# Patient Record
Sex: Male | Born: 1960 | Marital: Married | Smoking: Current every day smoker
Health system: Southern US, Community
[De-identification: ages and names within clinical notes are randomized; demographics above are authoritative.]

## PROBLEM LIST (undated history)

## (undated) DIAGNOSIS — M1990 Unspecified osteoarthritis, unspecified site: Secondary | ICD-10-CM

## (undated) DIAGNOSIS — F1010 Alcohol abuse, uncomplicated: Secondary | ICD-10-CM

## (undated) DIAGNOSIS — K5900 Constipation, unspecified: Secondary | ICD-10-CM

## (undated) DIAGNOSIS — I83813 Varicose veins of bilateral lower extremities with pain: Secondary | ICD-10-CM

## (undated) DIAGNOSIS — Z720 Tobacco use: Secondary | ICD-10-CM

## (undated) DIAGNOSIS — E785 Hyperlipidemia, unspecified: Secondary | ICD-10-CM

## (undated) DIAGNOSIS — M5430 Sciatica, unspecified side: Secondary | ICD-10-CM

## (undated) DIAGNOSIS — I2510 Atherosclerotic heart disease of native coronary artery without angina pectoris: Secondary | ICD-10-CM

## (undated) DIAGNOSIS — I252 Old myocardial infarction: Secondary | ICD-10-CM

## (undated) DIAGNOSIS — E782 Mixed hyperlipidemia: Secondary | ICD-10-CM

## (undated) DIAGNOSIS — G8929 Other chronic pain: Secondary | ICD-10-CM

## (undated) HISTORY — DX: Old myocardial infarction: I25.2

## (undated) HISTORY — DX: Alcohol abuse, uncomplicated: F10.10

## (undated) HISTORY — DX: Mixed hyperlipidemia: E78.2

## (undated) HISTORY — DX: Tobacco use: Z72.0

## (undated) HISTORY — DX: Varicose veins of bilateral lower extremities with pain: I83.813

## (undated) HISTORY — DX: Sciatica, unspecified side: M54.30

## (undated) HISTORY — DX: Constipation, unspecified: K59.00

## (undated) HISTORY — DX: Unspecified osteoarthritis, unspecified site: M19.90

## (undated) HISTORY — DX: Other chronic pain: G89.29

---

## 2017-04-05 ENCOUNTER — Inpatient Hospital Stay (HOSPITAL_COMMUNITY)
Admission: EM | Admit: 2017-04-05 | Discharge: 2017-04-07 | DRG: 247 | Disposition: A | Discharge status: Home / Self Care | Payer: BLUE CROSS/BLUE SHIELD | Source: Other Acute Inpatient Hospital | Attending: Interventional Cardiology | Admitting: Interventional Cardiology

## 2017-04-05 ENCOUNTER — Encounter (HOSPITAL_COMMUNITY)
Admission: EM | Disposition: A | Discharge status: Home / Self Care | Payer: Self-pay | Source: Other Acute Inpatient Hospital | Attending: Interventional Cardiology

## 2017-04-05 ENCOUNTER — Encounter (HOSPITAL_COMMUNITY): Payer: Self-pay | Admitting: Cardiology

## 2017-04-05 DIAGNOSIS — F17210 Nicotine dependence, cigarettes, uncomplicated: Secondary | ICD-10-CM | POA: Diagnosis present

## 2017-04-05 DIAGNOSIS — I2119 ST elevation (STEMI) myocardial infarction involving other coronary artery of inferior wall: Principal | ICD-10-CM | POA: Diagnosis present

## 2017-04-05 DIAGNOSIS — I252 Old myocardial infarction: Secondary | ICD-10-CM

## 2017-04-05 DIAGNOSIS — Z8249 Family history of ischemic heart disease and other diseases of the circulatory system: Secondary | ICD-10-CM | POA: Diagnosis not present

## 2017-04-05 DIAGNOSIS — I2510 Atherosclerotic heart disease of native coronary artery without angina pectoris: Secondary | ICD-10-CM | POA: Diagnosis present

## 2017-04-05 DIAGNOSIS — E782 Mixed hyperlipidemia: Secondary | ICD-10-CM | POA: Diagnosis present

## 2017-04-05 DIAGNOSIS — Z955 Presence of coronary angioplasty implant and graft: Secondary | ICD-10-CM | POA: Diagnosis not present

## 2017-04-05 DIAGNOSIS — Z720 Tobacco use: Secondary | ICD-10-CM

## 2017-04-05 DIAGNOSIS — R0789 Other chest pain: Secondary | ICD-10-CM | POA: Diagnosis present

## 2017-04-05 DIAGNOSIS — F1010 Alcohol abuse, uncomplicated: Secondary | ICD-10-CM

## 2017-04-05 HISTORY — DX: Old myocardial infarction: I25.2

## 2017-04-05 HISTORY — DX: Atherosclerotic heart disease of native coronary artery without angina pectoris: I25.10

## 2017-04-05 HISTORY — DX: Tobacco use: Z72.0

## 2017-04-05 HISTORY — DX: Alcohol abuse, uncomplicated: F10.10

## 2017-04-05 HISTORY — PX: CORONARY STENT INTERVENTION: CATH118234

## 2017-04-05 HISTORY — PX: LEFT HEART CATH AND CORONARY ANGIOGRAPHY: CATH118249

## 2017-04-05 HISTORY — DX: Hyperlipidemia, unspecified: E78.5

## 2017-04-05 LAB — COMPREHENSIVE METABOLIC PANEL
ALBUMIN: 3.6 g/dL (ref 3.5–5.0)
ALK PHOS: 75 U/L (ref 38–126)
ALT: 32 U/L (ref 17–63)
ANION GAP: 12 (ref 5–15)
AST: 36 U/L (ref 15–41)
BILIRUBIN TOTAL: 0.9 mg/dL (ref 0.3–1.2)
BUN: 14 mg/dL (ref 6–20)
CO2: 17 mmol/L — ABNORMAL LOW (ref 22–32)
Calcium: 8.7 mg/dL — ABNORMAL LOW (ref 8.9–10.3)
Chloride: 104 mmol/L (ref 101–111)
Creatinine, Ser: 0.91 mg/dL (ref 0.61–1.24)
GFR calc non Af Amer: >60 mL/min (ref >60)
GLUCOSE: 184 mg/dL — AB (ref 65–99)
Potassium: 3.2 mmol/L — ABNORMAL LOW (ref 3.5–5.1)
Sodium: 133 mmol/L — ABNORMAL LOW (ref 135–145)
Total Protein: 6.4 g/dL — ABNORMAL LOW (ref 6.5–8.1)

## 2017-04-05 LAB — TROPONIN I
TROPONIN I: 0.93 ng/mL — AB (ref <0.03)
TROPONIN I: 4.64 ng/mL — AB (ref <0.03)
Troponin I: 0.06 ng/mL (ref <0.03)
Troponin I: 3.16 ng/mL (ref <0.03)

## 2017-04-05 LAB — CBC
HCT: 42.9 % (ref 39.0–52.0)
HEMOGLOBIN: 14.4 g/dL (ref 13.0–17.0)
MCH: 32.1 pg (ref 26.0–34.0)
MCHC: 33.6 g/dL (ref 30.0–36.0)
MCV: 95.8 fL (ref 78.0–100.0)
Platelets: 190 10*3/uL (ref 150–400)
RBC: 4.48 MIL/uL (ref 4.22–5.81)
RDW: 12.3 % (ref 11.5–15.5)
WBC: 7.3 10*3/uL (ref 4.0–10.5)

## 2017-04-05 LAB — POCT I-STAT, CHEM 8
BUN: 16 mg/dL (ref 6–20)
CHLORIDE: 103 mmol/L (ref 101–111)
Calcium, Ion: 1.17 mmol/L (ref 1.15–1.40)
Creatinine, Ser: 0.7 mg/dL (ref 0.61–1.24)
GLUCOSE: 181 mg/dL — AB (ref 65–99)
HCT: 41 % (ref 39.0–52.0)
HEMOGLOBIN: 13.9 g/dL (ref 13.0–17.0)
POTASSIUM: 3.3 mmol/L — AB (ref 3.5–5.1)
SODIUM: 135 mmol/L (ref 135–145)
TCO2: 19 mmol/L (ref 0–100)

## 2017-04-05 LAB — BRAIN NATRIURETIC PEPTIDE: B NATRIURETIC PEPTIDE 5: 38.1 pg/mL (ref 0.0–100.0)

## 2017-04-05 LAB — MRSA PCR SCREENING: MRSA by PCR: NEGATIVE

## 2017-04-05 LAB — MAGNESIUM: MAGNESIUM: 2.1 mg/dL (ref 1.7–2.4)

## 2017-04-05 LAB — LIPID PANEL
CHOLESTEROL: 198 mg/dL (ref 0–200)
HDL: 32 mg/dL — ABNORMAL LOW (ref >40)
LDL CALC: 106 mg/dL — AB (ref 0–99)
Total CHOL/HDL Ratio: 6.2 RATIO
Triglycerides: 302 mg/dL — ABNORMAL HIGH (ref <150)
VLDL: 60 mg/dL — AB (ref 0–40)

## 2017-04-05 LAB — PROTIME-INR
INR: 1.22
PROTHROMBIN TIME: 15.5 s — AB (ref 11.4–15.2)

## 2017-04-05 LAB — PHOSPHORUS: PHOSPHORUS: 3.2 mg/dL (ref 2.5–4.6)

## 2017-04-05 LAB — APTT: aPTT: >200 seconds (ref 24–36)

## 2017-04-05 LAB — POCT ACTIVATED CLOTTING TIME: ACTIVATED CLOTTING TIME: 323 s

## 2017-04-05 SURGERY — LEFT HEART CATH AND CORONARY ANGIOGRAPHY
Anesthesia: LOCAL

## 2017-04-05 MED ORDER — FENTANYL CITRATE (PF) 100 MCG/2ML IJ SOLN
INTRAMUSCULAR | Status: DC | PRN
  Administered 2017-04-05: 50 ug via INTRAVENOUS

## 2017-04-05 MED ORDER — TICAGRELOR 90 MG PO TABS
ORAL_TABLET | ORAL | Status: AC
Start: 2017-04-05 — End: ?
  Filled 2017-04-05: qty 2

## 2017-04-05 MED ORDER — VITAMIN B-1 100 MG PO TABS
100.0000 mg | ORAL_TABLET | Freq: Every day | ORAL | Status: DC
  Administered 2017-04-05 – 2017-04-07 (×3): 100 mg via ORAL
  Filled 2017-04-05 (×3): qty 1

## 2017-04-05 MED ORDER — HEPARIN (PORCINE) IN NACL 2-0.9 UNIT/ML-% IJ SOLN
INTRAMUSCULAR | Status: AC | PRN
  Administered 2017-04-05: 1500 mL

## 2017-04-05 MED ORDER — OXYCODONE-ACETAMINOPHEN 5-325 MG PO TABS
1.0000 | ORAL_TABLET | ORAL | Status: DC | PRN
  Administered 2017-04-05: 2 via ORAL
  Filled 2017-04-05: qty 2

## 2017-04-05 MED ORDER — SODIUM CHLORIDE 0.9% FLUSH: 3.0000 mL | INTRAVENOUS | Status: DC | PRN

## 2017-04-05 MED ORDER — HEPARIN (PORCINE) IN NACL 2-0.9 UNIT/ML-% IJ SOLN
INTRAMUSCULAR | Status: AC
Start: 2017-04-05 — End: ?
  Filled 2017-04-05: qty 1000

## 2017-04-05 MED ORDER — TIROFIBAN (AGGRASTAT) BOLUS VIA INFUSION
INTRAVENOUS | Status: DC | PRN
  Administered 2017-04-05: 1825 ug via INTRAVENOUS

## 2017-04-05 MED ORDER — IOPAMIDOL (ISOVUE-370) INJECTION 76%
INTRAVENOUS | Status: DC | PRN
  Administered 2017-04-05: 110 mL via INTRA_ARTERIAL

## 2017-04-05 MED ORDER — ONDANSETRON HCL 4 MG/2ML IJ SOLN: 4.0000 mg | Freq: Four times a day (QID) | INTRAMUSCULAR | Status: DC | PRN

## 2017-04-05 MED ORDER — HEPARIN (PORCINE) IN NACL 2-0.9 UNIT/ML-% IJ SOLN
INTRAMUSCULAR | Status: AC
Start: 2017-04-05 — End: ?
  Filled 2017-04-05: qty 500

## 2017-04-05 MED ORDER — HEPARIN (PORCINE) IN NACL 2-0.9 UNIT/ML-% IJ SOLN
INTRAMUSCULAR | Status: DC | PRN
  Administered 2017-04-05: 10 mL via INTRA_ARTERIAL

## 2017-04-05 MED ORDER — TICAGRELOR 90 MG PO TABS
ORAL_TABLET | ORAL | Status: DC | PRN
  Administered 2017-04-05: 180 mg via ORAL

## 2017-04-05 MED ORDER — TICAGRELOR 90 MG PO TABS
90.0000 mg | ORAL_TABLET | Freq: Two times a day (BID) | ORAL | Status: DC
  Administered 2017-04-05 – 2017-04-07 (×4): 90 mg via ORAL
  Filled 2017-04-05 (×4): qty 1

## 2017-04-05 MED ORDER — HEPARIN SODIUM (PORCINE) 1000 UNIT/ML IJ SOLN
INTRAMUSCULAR | Status: AC
  Filled 2017-04-05: qty 1

## 2017-04-05 MED ORDER — FOLIC ACID 1 MG PO TABS
1.0000 mg | ORAL_TABLET | Freq: Every day | ORAL | Status: DC
  Administered 2017-04-05 – 2017-04-07 (×3): 1 mg via ORAL
  Filled 2017-04-05 (×3): qty 1

## 2017-04-05 MED ORDER — SODIUM CHLORIDE 0.9 % IV SOLN: INTRAVENOUS | Status: AC

## 2017-04-05 MED ORDER — ACETAMINOPHEN 325 MG PO TABS: 650.0000 mg | ORAL_TABLET | ORAL | Status: DC | PRN

## 2017-04-05 MED ORDER — MIDAZOLAM HCL 2 MG/2ML IJ SOLN
INTRAMUSCULAR | Status: AC
  Filled 2017-04-05: qty 2

## 2017-04-05 MED ORDER — HEPARIN SODIUM (PORCINE) 1000 UNIT/ML IJ SOLN
INTRAMUSCULAR | Status: DC | PRN
  Administered 2017-04-05: 10000 [IU] via INTRAVENOUS

## 2017-04-05 MED ORDER — LIDOCAINE HCL 1 % IJ SOLN
INTRAMUSCULAR | Status: AC
  Filled 2017-04-05: qty 20

## 2017-04-05 MED ORDER — MIDAZOLAM HCL 2 MG/2ML IJ SOLN
INTRAMUSCULAR | Status: DC | PRN
  Administered 2017-04-05: 1 mg via INTRAVENOUS

## 2017-04-05 MED ORDER — TIROFIBAN HCL IN NACL 5-0.9 MG/100ML-% IV SOLN
INTRAVENOUS | Status: AC
  Filled 2017-04-05: qty 100

## 2017-04-05 MED ORDER — FENTANYL CITRATE (PF) 100 MCG/2ML IJ SOLN
INTRAMUSCULAR | Status: AC
  Filled 2017-04-05: qty 2

## 2017-04-05 MED ORDER — VERAPAMIL HCL 2.5 MG/ML IV SOLN
INTRAVENOUS | Status: AC
  Filled 2017-04-05: qty 2

## 2017-04-05 MED ORDER — TIROFIBAN HCL IN NACL 5-0.9 MG/100ML-% IV SOLN
INTRAVENOUS | Status: AC | PRN
  Administered 2017-04-05: 0.15 ug/kg/min via INTRAVENOUS

## 2017-04-05 MED ORDER — NITROGLYCERIN 1 MG/10 ML FOR IR/CATH LAB
INTRA_ARTERIAL | Status: AC
  Filled 2017-04-05: qty 10

## 2017-04-05 MED ORDER — ATORVASTATIN CALCIUM 80 MG PO TABS
80.0000 mg | ORAL_TABLET | Freq: Every day | ORAL | Status: DC
Start: 2017-04-05 — End: 2017-04-07
  Administered 2017-04-05 – 2017-04-06 (×2): 80 mg via ORAL
  Filled 2017-04-05 (×2): qty 1

## 2017-04-05 MED ORDER — SODIUM CHLORIDE 0.9 % IV SOLN: 250.0000 mL | INTRAVENOUS | Status: DC | PRN

## 2017-04-05 MED ORDER — SODIUM CHLORIDE 0.9% FLUSH
3.0000 mL | Freq: Two times a day (BID) | INTRAVENOUS | Status: DC
  Administered 2017-04-05 – 2017-04-07 (×3): 3 mL via INTRAVENOUS

## 2017-04-05 MED ORDER — NITROGLYCERIN 1 MG/10 ML FOR IR/CATH LAB
INTRA_ARTERIAL | Status: DC | PRN
  Administered 2017-04-05: 200 ug via INTRACORONARY

## 2017-04-05 MED ORDER — IOPAMIDOL (ISOVUE-370) INJECTION 76%
INTRAVENOUS | Status: AC
  Filled 2017-04-05: qty 125

## 2017-04-05 MED ORDER — TIROFIBAN HCL IV 12.5 MG/250 ML
0.1500 ug/kg/min | INTRAVENOUS | Status: AC
  Administered 2017-04-05: 0.15 ug/kg/min via INTRAVENOUS
  Filled 2017-04-05: qty 250

## 2017-04-05 MED ORDER — NITROGLYCERIN IN D5W 200-5 MCG/ML-% IV SOLN: 20.0000 ug/min | INTRAVENOUS | Status: AC

## 2017-04-05 MED ORDER — HEPARIN SODIUM (PORCINE) 5000 UNIT/ML IJ SOLN
5000.0000 [IU] | Freq: Three times a day (TID) | INTRAMUSCULAR | Status: DC
  Administered 2017-04-05 – 2017-04-07 (×5): 5000 [IU] via SUBCUTANEOUS
  Filled 2017-04-05 (×5): qty 1

## 2017-04-05 MED ORDER — LORAZEPAM 2 MG/ML IJ SOLN: 2.0000 mg | INTRAMUSCULAR | Status: DC | PRN

## 2017-04-05 MED ORDER — ASPIRIN 81 MG PO CHEW
81.0000 mg | CHEWABLE_TABLET | Freq: Every day | ORAL | Status: DC
  Administered 2017-04-06 – 2017-04-07 (×2): 81 mg via ORAL
  Filled 2017-04-05 (×2): qty 1

## 2017-04-05 SURGICAL SUPPLY — 19 items
BALLN EMERGE MR 2.5X15 (BALLOONS) ×2
BALLN ~~LOC~~ EUPHORA RX 4.0X20 (BALLOONS) ×2
BALLOON EMERGE MR 2.5X15 (BALLOONS) ×1 IMPLANT
BALLOON ~~LOC~~ EUPHORA RX 4.0X20 (BALLOONS) ×1 IMPLANT
CATH INFINITI 5 FR JL3.5 (CATHETERS) ×2 IMPLANT
CATH LAUNCHER 6FR JR4 (CATHETERS) ×2 IMPLANT
DEVICE RAD COMP TR BAND LRG (VASCULAR PRODUCTS) ×2 IMPLANT
ELECT DEFIB PAD ADLT CADENCE (PAD) ×2 IMPLANT
GLIDESHEATH SLEND A-KIT 6F 22G (SHEATH) ×2 IMPLANT
GUIDEWIRE INQWIRE 1.5J.035X260 (WIRE) ×1 IMPLANT
INQWIRE 1.5J .035X260CM (WIRE) ×2
KIT ENCORE 26 ADVANTAGE (KITS) ×2 IMPLANT
KIT HEART LEFT (KITS) ×2 IMPLANT
PACK CARDIAC CATHETERIZATION (CUSTOM PROCEDURE TRAY) ×2 IMPLANT
STENT RESOLUTE ONYX 3.5X38 (Permanent Stent) ×2 IMPLANT
TRANSDUCER W/STOPCOCK (MISCELLANEOUS) ×2 IMPLANT
TUBING CIL FLEX 10 FLL-RA (TUBING) ×2 IMPLANT
WIRE ASAHI PROWATER 180CM (WIRE) ×2 IMPLANT
WIRE ASAHI PROWATER 300CM (WIRE) IMPLANT

## 2017-04-05 NOTE — Progress Notes (Signed)
Chaplain was paged for a Stemi. Chaplain met Pt in hallway tranprterd to cath lab. Family were already sent up. Chaplain went to Alexander Larson and help son check in.    04/05/17 0700  Clinical Encounter Type  Visited With Family  Visit Type Initial  Referral From Care management  Stress Factors  Family Stress Factors None identified

## 2017-04-05 NOTE — Progress Notes (Signed)
CRITICAL VALUE ALERT  Critical Value:  Trop 0.06  Date & Time Notied:  04/05/17 0800  Provider Notified: Katrinka BlazingSmith  Orders Received/Actions taken:

## 2017-04-05 NOTE — H&P (Addendum)
The patient has been seen in conjunction with Reino Bellis, NP. All aspects of care have been considered and discussed. The patient has been personally interviewed, examined, and all clinical data has been reviewed.   The patient was personally met exam once a day. He appeared very ill with diaphoresis, nausea, and a somewhat gray skin color. Ongoing chest discomfort was indoor stent. It started suddenly at 7 AM at work. Only significant medical history is tobacco abuse.  A brief examination was performed demonstrating brisk/palpable carotid radial and posterior tibial pulses bilaterally. No murmur or rub was heard. Lungs were clear. No peripheral edema was noted.  He was brought immediately to the catheterization laboratory where his exam was completed and felt to be normal.  Transmitted EKG demonstrated ST elevation II, III, aVF, V5 and V6. A subsequent EKG upon arrival reveal resolution of ST elevation in V5 and V6 but with ongoing inferior changes. EKGs were personally reviewed.  He was counseled undergo immediate cardiac catheterization and mechanical revascularization if indicated and was brought directly to the cardiac catheterization laboratory without stopping in the ER.  Please note that after speaking with the son, patient drinks alcohol daily and according to the son heavily. Therefore we need to watch for signs of alcohol withdrawal.  Critical care time 31 minutes.    History & Physical    Patient ID: Alexander Larson MRN: [id number], DOB/AGE: 1961/04/25   Admit date: 04/05/2017   Primary Physician: Alycia Rossetti, MD Primary Cardiologist: Suan Halter)   Patient Profile    56 yo male with no PMH other then tobacco use who presented with chest pain, called a Code Stemi in the field.   Past Medical History   Past Medical History:  Diagnosis Date  . Tobacco use     History reviewed. No pertinent surgical history.   Allergies  No Known Allergies  History of  Present Illness    Alexander Larson is a 56 yo male with only reported hx of tobacco use. Reports he developed SScp at 7am this morning while at work. Works at [employer], doing physical labor. On EMS arrival he was given 324 ASA, but vomited this up. He was pale and diaphoretic. Given Zofran and 1 SL nitro with no improvement in pain. EKG showed inferior ST elevation with lateral ST depression. Code STEMI was activated in the field. Was noted to be hypertensive with EMS. He was brought to the cath lab emergent for cardiac catheterization with Dr. Tamala Julian.   Home Medications    Prior to Admission medications   Not on File    Family History    Family History  Problem Relation Age of Onset  . Hypertension Father     Social History    Social History   Social History  . Marital status: Married    Spouse name: N/A  . Number of children: N/A  . Years of education: N/A   Occupational History  . Not on file.   Social History Main Topics  . Smoking status: Current Every Day Smoker    Types: Cigarettes  . Smokeless tobacco: Not on file  . Alcohol use Not on file  . Drug use: Unknown  . Sexual activity: Not on file   Other Topics Concern  . Not on file   Social History Narrative  . No narrative on file     Review of Systems    See HPI  All other systems reviewed and are otherwise negative except as noted  above.  Physical Exam    SpO2 100 %.  General: WM, pale and diaphoretic  Psych: Normal affect. Neuro: Alert and oriented X 3. Moves all extremities spontaneously. HEENT: Normal  Neck: Supple without bruits or JVD. Lungs:  Resp regular and unlabored, CTA. Heart: RRR no s3, s4, or murmurs. Abdomen: Soft, non-tender, non-distended, BS + x 4.  Extremities: No clubbing, cyanosis or edema. DP/PT/Radials 2+ and equal bilaterally.  Labs    Troponin (Point of Care Test) No results for input(s): TROPIPOC in the last 72 hours. No results for input(s): CKTOTAL, CKMB, TROPONINI in  the last 72 hours. No results found for: WBC, HGB, HCT, MCV, PLT No results for input(s): NA, K, CL, CO2, BUN, CREATININE, CALCIUM, PROT, BILITOT, ALKPHOS, ALT, AST, GLUCOSE in the last 168 hours.  Invalid input(s): LABALBU No results found for: CHOL, HDL, LDLCALC, TRIG No results found for: DDIMER   Radiology Studies    No results found.  ECG & Cardiac Imaging    EKG: SR with ST elevation in inferior leads  Assessment & Plan    56 yo male with no PMH other then tobacco use who presented with chest pain, called a Code Stemi in the field.   STEMI: Presented with sudden onset of SScp with nausea and diaphoresis while at work this morning at 7am. EKG showed ST elevation in inferior leads. Given 324 ASA, 1 SL nitro and zofran PTA. Brought directly to the cath lab for emergent cardiac catheterization. Further recommendations following procedure.   Signed, Lindsay Roberts, NP-C Pager [phone] 04/05/2017, 7:45 AM  

## 2017-04-05 NOTE — Progress Notes (Signed)
CRITICAL VALUE ALERT  Critical Value:  APTT >200  Date & Time Notied:  04/05/17 0920  Provider Notified: Su Hiltoberts  Orders Received/Actions taken: no new orders

## 2017-04-05 NOTE — Progress Notes (Signed)
CRITICAL VALUE ALERT  Critical Value:  Trop 0.93  Date & Time Notied:  04/05/17 1117  Provider Notified: Su Hiltoberts  Orders Received/Actions taken: no new orders, patient stable

## 2017-04-05 NOTE — Progress Notes (Signed)
   04/05/17 0950  Clinical Encounter Type  Visited With Patient  Visit Type Follow-up  Spiritual Encounters  Spiritual Needs Emotional  Stress Factors  Patient Stress Factors Health changes  Introduction to Pt. Pt reports coping well.

## 2017-04-06 ENCOUNTER — Encounter (HOSPITAL_COMMUNITY): Payer: Self-pay

## 2017-04-06 DIAGNOSIS — E782 Mixed hyperlipidemia: Secondary | ICD-10-CM

## 2017-04-06 DIAGNOSIS — Z955 Presence of coronary angioplasty implant and graft: Secondary | ICD-10-CM

## 2017-04-06 LAB — BASIC METABOLIC PANEL
ANION GAP: 11 (ref 5–15)
BUN: 13 mg/dL (ref 6–20)
CALCIUM: 9 mg/dL (ref 8.9–10.3)
CO2: 20 mmol/L — AB (ref 22–32)
Chloride: 103 mmol/L (ref 101–111)
Creatinine, Ser: 0.84 mg/dL (ref 0.61–1.24)
GFR calc Af Amer: >60 mL/min (ref >60)
GFR calc non Af Amer: >60 mL/min (ref >60)
Glucose, Bld: 104 mg/dL — ABNORMAL HIGH (ref 65–99)
Potassium: 4 mmol/L (ref 3.5–5.1)
Sodium: 134 mmol/L — ABNORMAL LOW (ref 135–145)

## 2017-04-06 LAB — HEMOGLOBIN A1C
HEMOGLOBIN A1C: 5.6 % (ref 4.8–5.6)
Mean Plasma Glucose: 114 mg/dL

## 2017-04-06 MED ORDER — OMEGA-3-ACID ETHYL ESTERS 1 G PO CAPS
2.0000 g | ORAL_CAPSULE | Freq: Every day | ORAL | Status: DC
  Administered 2017-04-06 – 2017-04-07 (×2): 2 g via ORAL
  Filled 2017-04-06 (×2): qty 2

## 2017-04-06 MED ORDER — CARVEDILOL 3.125 MG PO TABS
3.1250 mg | ORAL_TABLET | Freq: Two times a day (BID) | ORAL | Status: DC
  Administered 2017-04-06 – 2017-04-07 (×2): 3.125 mg via ORAL
  Filled 2017-04-06 (×2): qty 1

## 2017-04-06 MED ORDER — LISINOPRIL 2.5 MG PO TABS
2.5000 mg | ORAL_TABLET | Freq: Every day | ORAL | Status: DC
  Administered 2017-04-06 – 2017-04-07 (×2): 2.5 mg via ORAL
  Filled 2017-04-06 (×2): qty 1

## 2017-04-06 NOTE — Progress Notes (Signed)
Transferred from [facility] ambulatory .

## 2017-04-06 NOTE — Progress Notes (Addendum)
CARDIAC REHAB PHASE I   PRE:  Rate/Rhythm: 72 SR    BP: sitting 125/82    SaO2:   MODE:  Ambulation: 370 ft   POST:  Rate/Rhythm: 90 SR    BP: sitting 139/91     SaO2:   Tolerated well, no c/o. Feels well. BP slightly elevated. Ed completed with pt and son, good reception. Understands importance of Brilinta and is awaiting Brilinta card from CM. He has quit smoking previously and is willing to quit again. Gave resources. Discussed decreasing ETOH as well since triglycerides are high. Good reception. [phone]   Harriet MassonRandi Kristan Freddy Kinne CES, ACSM 04/06/2017 11:59 AM

## 2017-04-06 NOTE — Progress Notes (Signed)
Progress Note  Patient Name: Alexander Larson Date of Encounter: 04/06/2017  Primary Cardiologist: Katrinka BlazingSmith (new)  Subjective   No recurrent chest pain  Inpatient Medications    Scheduled Meds: . aspirin  81 mg Oral Daily  . atorvastatin  80 mg Oral q1800  . folic acid  1 mg Oral Daily  . heparin  5,000 Units Subcutaneous Q8H  . sodium chloride flush  3 mL Intravenous Q12H  . thiamine  100 mg Oral Daily  . ticagrelor  90 mg Oral BID   Continuous Infusions: . sodium chloride     PRN Meds: sodium chloride, acetaminophen, LORazepam, ondansetron (ZOFRAN) IV, oxyCODONE-acetaminophen, sodium chloride flush   Vital Signs    Vitals:   04/06/17 1100 04/06/17 1200 04/06/17 1300 04/06/17 1338  BP:    113/80  Pulse:      Resp: 20 14 (!) 21 18  Temp:      TempSrc:      SpO2: 95%     Weight:    160 lb 15 oz (73 kg)  Height:    5' 7.32" (1.71 m)    Intake/Output Summary (Last 24 hours) at 04/06/17 1401 Last data filed at 04/06/17 1300  Gross per 24 hour  Intake              240 ml  Output              800 ml  Net             -560 ml    I/O since admission:   Filed Weights   04/05/17 0900 04/06/17 0300 04/06/17 1338  Weight: 160 lb 15 oz (73 kg) 160 lb 15 oz (73 kg) 160 lb 15 oz (73 kg)    Telemetry    Sinus in the 70s - Personally Reviewed  ECG    Will repeat ECG today  ECG (independently read by me): SB at 55; nonspecific T change  Physical Exam   BP 113/80   Pulse 72   Temp 99 F (37.2 C) (Oral)   Resp 18   Ht 5' 7.32" (1.71 m)   Wt 160 lb 15 oz (73 kg)   SpO2 95%   BMI 24.96 kg/m  General: Alert, oriented, no distress.  Skin: normal turgor, no rashes, warm and dry HEENT: Normocephalic, atraumatic. Pupils equal round and reactive to light; sclera anicteric; extraocular muscles intact;  Nose without nasal septal hypertrophy Mouth/Parynx benign; Mallinpatti scale 3 Neck: No JVD, no carotid bruits; normal carotid upstroke Lungs: clear to  ausculatation and percussion; no wheezing or rales Chest wall: without tenderness to palpitation Heart: PMI not displaced, RRR, s1 s2 normal, 1/6 systolic murmur, no diastolic murmur, no rubs, gallops, thrills, or heaves Abdomen: soft, nontender; no hepatosplenomehaly, BS+; abdominal aorta nontender and not dilated by palpation. Back: no CVA tenderness Pulses 2+ Musculoskeletal: full range of motion, normal strength, no joint deformities Extremities: no clubbing cyanosis or edema, Homan's sign negative  Neurologic: grossly nonfocal; Cranial nerves grossly wnl Psychologic: Normal mood and affect   Labs    Chemistry Recent Labs Lab 04/05/17 0800 04/05/17 0803 04/06/17 0231  NA 135 133* 134*  K 3.3* 3.2* 4.0  CL 103 104 103  CO2  --  17* 20*  GLUCOSE 181* 184* 104*  BUN 16 14 13   CREATININE 0.70 0.91 0.84  CALCIUM  --  8.7* 9.0  PROT  --  6.4*  --   ALBUMIN  --  3.6  --  AST  --  36  --   ALT  --  32  --   ALKPHOS  --  75  --   BILITOT  --  0.9  --   GFRNONAA  --  >60 >60  GFRAA  --  >60 >60  ANIONGAP  --  12 11     Hematology Recent Labs Lab 04/05/17 0800 04/05/17 0803  WBC  --  7.3  RBC  --  4.48  HGB 13.9 14.4  HCT 41.0 42.9  MCV  --  95.8  MCH  --  32.1  MCHC  --  33.6  RDW  --  12.3  PLT  --  190    Cardiac Enzymes Recent Labs Lab 04/05/17 0803 04/05/17 1006 04/05/17 1420 04/05/17 2154  TROPONINI 0.06* 0.93* 4.64* 3.16*   No results for input(s): TROPIPOC in the last 168 hours.   BNP Recent Labs Lab 04/05/17 1006  BNP 38.1     DDimer No results for input(s): DDIMER in the last 168 hours.   Lipid Panel     Component Value Date/Time   CHOL 198 04/05/2017 0803   TRIG 302 (H) 04/05/2017 0803   HDL 32 (L) 04/05/2017 0803   CHOLHDL 6.2 04/05/2017 0803   VLDL 60 (H) 04/05/2017 0803   LDLCALC 106 (H) 04/05/2017 1610    Radiology    No results found.  Cardiac Studies   Conclusion    Acute inferior ST elevation myocardial  infarction due to acute occlusion of the near ostial RCA.  Successful PTCA and stenting of the totally occluded proximal segment of the RCA with a 38 x 3.5 Onyx DES postdilated to 4 mm in diameter with subsequent 0% with TIMI grade 3 flow.  Widely patent left coronary system. The left system supplied collaterals to the very distal RCA.  Moderate inferobasal hypokinesis. EF 55%. LVEDP is normal.  RECOMMENDATIONS:   Dual antiplatelet therapy, aspirin and Brilinta.  Probable discharge tomorrow if stable. Occlusion time was very short.  Aggressive risk factor modification including smoking cessation.          56 y.o. male who presented with inferior STEMI on 04/05/17.  Assessment & Plan    1. Inferior STEMI: Day 1; cath data reviewed. S/p DES stent to RCA. Now on ASA/Brilinta.  Will add low dose BB with coreg and ACE-I.  Trop 4.64  F/U ECG today.  2: Hyperlipidemia; now on atorvastatin 80 mg. With mixed hyperlipidemic picture will add lovaza.  3. Remote tobacco  Cardiac Rehab;  Begin ambulation.  Will transfer to stepdown.  Possible dc tomorrow if stable.    Signed, Lennette Bihari, MD, [facility] 04/06/2017, 2:01 PM

## 2017-04-06 NOTE — Care Management Note (Addendum)
Case Management Note Marvetta Gibbons RN, BSN Unit 2W-Case Manager-- [place] coverage [phone]  Patient Details  Name: Alexander Larson MRN: [id number] Date of Birth: 11-22-1960  Subjective/Objective:  Pt admitted with STEMI- s/p cath with DES                  Action/Plan: PTA pt lived at home- independent- plan to return home- call received from Cardiac rehab that pt started on Brilinta- per Epic pt shows no insurance- spoke with pt at bedside- pt states he does have insurance- had card with him- copy made of his card-and will fax to admitting- per pt [facility] [address]- [facility]- [facility]- [phone])- and they do have Brilinta in stock to fill today. Will submit for insurance copay cost. Pt given 30 day free card to use on discharge along with copay assist card for Brilinta-- per insurance check pt has not met out of pocket deductible- this info shared with pt at bedside- pt voiced understanding and has both Brilinta cards to use.   Expected Discharge Date:  04/06/17               Expected Discharge Plan:  Home/Self Care  In-House Referral:     Discharge planning Services  CM Consult, Medication Assistance  Post Acute Care Choice:  NA Choice offered to:  NA  DME Arranged:    DME Agency:     HH Arranged:    HH Agency:     Status of Service:  Completed, signed off  If discussed at [place] of Stay Meetings, dates discussed:    Discharge Disposition: home/self care   Additional Comments:  Dawayne Patricia, RN 04/06/2017, 1:42 PM

## 2017-04-06 NOTE — Progress Notes (Signed)
Per insurance check for Brilinta #4. S/W ANGELA @ [facility] # [phone]   1. BRILINTA  90 MG BID   COVER- YES  CO-PAY- $ 347.87 (co pay will be lower once deductible met) TIER- 2 DRUG  PRIOR APPROVAL- NO  90 DAY SUPPLY MAIL-ORDER AND RETAIL $ 952.92  DEDUCTIBLE NOT MET $ 3,000.00    2. CLOPIDOGREL 75 MG  BID AND DAILY   COVER- YES  CO-PAY- ZERO DOLLARS  TIER- 1 DRUG  PRIOR APPROVAL- NO  90 DAY SUPPLY FOR MAIL-ORDER AND RETAIL ZERO DOLLARS   PREFERRED PHARMACY : [facility]

## 2017-04-07 ENCOUNTER — Telehealth: Payer: Self-pay | Admitting: Interventional Cardiology

## 2017-04-07 ENCOUNTER — Encounter (HOSPITAL_COMMUNITY): Payer: Self-pay | Admitting: Cardiology

## 2017-04-07 DIAGNOSIS — I2510 Atherosclerotic heart disease of native coronary artery without angina pectoris: Secondary | ICD-10-CM

## 2017-04-07 LAB — HEMOGLOBIN A1C
Hgb A1c MFr Bld: 5.6 % (ref 4.8–5.6)
Mean Plasma Glucose: 114 mg/dL

## 2017-04-07 LAB — BASIC METABOLIC PANEL
Anion gap: 10 (ref 5–15)
BUN: 14 mg/dL (ref 6–20)
CALCIUM: 9.2 mg/dL (ref 8.9–10.3)
CO2: 21 mmol/L — AB (ref 22–32)
CREATININE: 0.88 mg/dL (ref 0.61–1.24)
Chloride: 103 mmol/L (ref 101–111)
Glucose, Bld: 104 mg/dL — ABNORMAL HIGH (ref 65–99)
Potassium: 3.8 mmol/L (ref 3.5–5.1)
SODIUM: 134 mmol/L — AB (ref 135–145)

## 2017-04-07 MED ORDER — TICAGRELOR 90 MG PO TABS: 90.0000 mg | ORAL_TABLET | Freq: Two times a day (BID) | ORAL | 10 refills | Status: DC

## 2017-04-07 MED ORDER — ATORVASTATIN CALCIUM 80 MG PO TABS: 80.0000 mg | ORAL_TABLET | Freq: Every day | ORAL | 0 refills | Status: DC

## 2017-04-07 MED ORDER — OMEGA-3-ACID ETHYL ESTERS 1 G PO CAPS: 2.0000 g | ORAL_CAPSULE | Freq: Every day | ORAL | 2 refills | Status: DC

## 2017-04-07 MED ORDER — NITROGLYCERIN 0.4 MG SL SUBL: 0.4000 mg | SUBLINGUAL_TABLET | SUBLINGUAL | 3 refills | Status: DC | PRN

## 2017-04-07 MED ORDER — TICAGRELOR 90 MG PO TABS: 90.0000 mg | ORAL_TABLET | Freq: Two times a day (BID) | ORAL | 0 refills | Status: DC

## 2017-04-07 MED ORDER — LISINOPRIL 2.5 MG PO TABS: 2.5000 mg | ORAL_TABLET | Freq: Every day | ORAL | 0 refills | Status: DC

## 2017-04-07 MED ORDER — ASPIRIN 81 MG PO CHEW: 81.0000 mg | CHEWABLE_TABLET | Freq: Every day | ORAL | Status: DC

## 2017-04-07 MED ORDER — CARVEDILOL 3.125 MG PO TABS: 3.1250 mg | ORAL_TABLET | Freq: Two times a day (BID) | ORAL | 2 refills | Status: DC

## 2017-04-07 NOTE — Progress Notes (Signed)
Discharge instructions printed and reviewed with patient and daughter, and copy given for them to take home. All questions addressed at this time. New prescriptions reviewed. Printed and reviewed educational handouts for brilinta, heart attack, radial site care, and cardiac catheterization with stenting. Emphasized signs and symptoms of heart attack and radial site bleeding, and to call 911 for these emergencies. IV site removed, and cath site assessed for bleeding/bruising (none noted). Room searched for patient belongings, and confirmed with patient that all valuables were accounted for. Staff escorted patient to discharge via wheelchair.

## 2017-04-07 NOTE — Care Management Note (Signed)
Case Management Note Original note created by Irven Shelling RN, BSN Unit 2W-Case Manager-- [place] coverage [phone]  Patient Details  Name: Alexander Larson MRN: [id number] Date of Birth: Feb 24, 1961  Subjective/Objective:  Pt admitted with STEMI- s/p cath with DES                  Action/Plan: PTA pt lived at home- independent- plan to return home- call received from Cardiac rehab that pt started on Brilinta- per Epic pt shows no insurance- spoke with pt at bedside- pt states he does have insurance- had card with him- copy made of his card-and will fax to admitting- per pt [facility] [address]- [facility]- [facility]- [phone])- and they do have Brilinta in stock to fill today. Will submit for insurance copay cost. Pt given 30 day free card to use on discharge along with copay assist card for Brilinta-- per insurance check pt has not met out of pocket deductible- this info shared with pt at bedside- pt voiced understanding and has both Brilinta cards to use.   Expected Discharge Date:  04/06/17               Expected Discharge Plan:  Home/Self Care  In-House Referral:     Discharge planning Services  CM Consult, Medication Assistance  Post Acute Care Choice:  NA Choice offered to:  NA  DME Arranged:    DME Agency:     HH Arranged:    HH Agency:     Status of Service:  Completed, signed off  If discussed at [place] of Stay Meetings, dates discussed:    Discharge Disposition: home/self care   Additional Comments: 04/07/2017 Pt alert and oriented.  CM again discussed copay information with pt for both Brilinta and Eliquis per [place] check.  Pt states the Brilinta copay will cause hardship.  CM requested bedside nurse and pt to relay hardship concern to attending.  CM also provided concern on physician sticky tab [name], RN 04/07/2017, 11:20  AM

## 2017-04-07 NOTE — Telephone Encounter (Signed)
Patient scheduled with TCM appt with Dr. Katrinka BlazingSmith on 04-20-17 @4 :00pm

## 2017-04-07 NOTE — Discharge Summary (Signed)
Discharge Summary    Patient ID: Alexander Larson,  MRN: [id number], DOB/AGE: 04/07/1961 56 y.o.  Admit date: 04/05/2017 Discharge date: 04/07/2017   Primary Care Provider: Salley Scarlet Primary Cardiologist: Katrinka Blazing   Discharge Diagnoses    Principal Problem:   Acute ST elevation myocardial infarction (STEMI) of inferior wall [place]) Active Problems:   CAD (coronary artery disease), native coronary artery   Tobacco abuse   Alcohol abuse   Status post coronary artery stent placement   Mixed hyperlipidemia   Allergies No Known Allergies  Diagnostic Studies/Procedures    LHC: 04/05/17  Conclusion    Acute inferior ST elevation myocardial infarction due to acute occlusion of the near ostial RCA.  Successful PTCA and stenting of the totally occluded proximal segment of the RCA with a 38 x 3.5 Onyx DES postdilated to 4 mm in diameter with subsequent 0% with TIMI grade 3 flow.  Widely patent left coronary system. The left system supplied collaterals to the very distal RCA.  Moderate inferobasal hypokinesis. EF 55%. LVEDP is normal.  RECOMMENDATIONS:   Dual antiplatelet therapy, aspirin and Brilinta.  Probable discharge tomorrow if stable. Occlusion time was very short.  Aggressive risk factor modification including smoking cessation.  _____________   History of Present Illness     56 yo male with no known PMH other than tobacco use who presented with SScp that started at 7am the morning of admission while at work. Works at [employer], doing physical labor. On EMS arrival he was given 324 ASA, but vomited this up. He was pale and diaphoretic. Given Zofran and 1 SL nitro with no improvement in pain. EKG showed inferior ST elevation with lateral ST depression. Code STEMI was activated in the field. Was noted to be hypertensive with EMS. He was brought to the cath lab emergent for cardiac catheterization with Dr. Katrinka Blazing.   Hospital Course     He underwent emergent cardiac  catheterization with Dr. Katrinka Blazing that showed acute occlusion of the osital RCA that was treated with DES x1 with TIMI grade 3 flow. Moderate inferobasal hypokinesis with EF of 55%. Plan for DAPT with ASA/Brilinta for at least one year. He was counseled on the need for smoking cessation. Post cath labs showed stable Cr and Hgb. He was transferred from the unit on 04/06/17. Worked well with cardiac rehab. No recurrence of chest pain. Trop peaked at 4.64. LDL 106, Trig 302. He was started on high dose Lipitor and Lovaza. Hgb A1c 5.6.   He was seen by Dr. Tresa Endo and determined stable for discharge home. Follow up has been arranged in the office. Medications are listed below.   General: Well developed, well nourished, male appearing in no acute distress. Head: Normocephalic, atraumatic.  Neck: Supple without bruits, JVD. Lungs:  Resp regular and unlabored, CTA. Heart: RRR, S1, S2, no S3, S4, or murmur; no rub. Abdomen: Soft, non-tender, non-distended with normoactive bowel sounds. No hepatomegaly. No rebound/guarding. No obvious abdominal masses. Extremities: No clubbing, cyanosis, edema. Distal pedal pulses are 2+ bilaterally. R radial cath site stable without bruising or hematoma Neuro: Alert and oriented X 3. Moves all extremities spontaneously. Psych: Normal affect.  _____________  Discharge Vitals Blood pressure 110/60, pulse 88, temperature 98.4 F (36.9 C), temperature source Oral, resp. rate 18, height 5' 7.32" (1.71 m), weight 160 lb 15 oz (73 kg), SpO2 90 %.  Filed Weights   04/05/17 0900 04/06/17 0300 04/06/17 1338  Weight: 160 lb 15 oz (73 kg)  160 lb 15 oz (73 kg) 160 lb 15 oz (73 kg)    Labs & Radiologic Studies    CBC  Recent Labs  04/05/17 0800 04/05/17 0803  WBC  --  7.3  HGB 13.9 14.4  HCT 41.0 42.9  MCV  --  95.8  PLT  --  190   Basic Metabolic Panel  Recent Labs  04/05/17 1006 04/06/17 0231 04/07/17 0247  NA  --  134* 134*  K  --  4.0 3.8  CL  --  103 103    CO2  --  20* 21*  GLUCOSE  --  104* 104*  BUN  --  13 14  CREATININE  --  0.84 0.88  CALCIUM  --  9.0 9.2  MG 2.1  --   --   PHOS 3.2  --   --    Liver Function Tests  Recent Labs  04/05/17 0803  AST 36  ALT 32  ALKPHOS 75  BILITOT 0.9  PROT 6.4*  ALBUMIN 3.6   No results for input(s): LIPASE, AMYLASE in the last 72 hours. Cardiac Enzymes  Recent Labs  04/05/17 1006 04/05/17 1420 04/05/17 2154  TROPONINI 0.93* 4.64* 3.16*   BNP Invalid input(s): POCBNP D-Dimer No results for input(s): DDIMER in the last 72 hours. Hemoglobin A1C  Recent Labs  04/06/17 0231  HGBA1C 5.6   Fasting Lipid Panel  Recent Labs  04/05/17 0803  CHOL 198  HDL 32*  LDLCALC 106*  TRIG 302*  CHOLHDL 6.2   Thyroid Function Tests No results for input(s): TSH, T4TOTAL, T3FREE, THYROIDAB in the last 72 hours.  Invalid input(s): FREET3 _____________  No results found. Disposition   Pt is being discharged home today in good condition.  Follow-up Plans & Appointments    Follow-up Information    Lyn RecordsSmith, Henry W, MD Follow up on 04/20/2017.   Specialty:  Cardiology Why:  at 4pm for your follow up appt.  Contact information: [address] [place] [address] [phone]          Discharge Instructions    Amb Referral to Cardiac Rehabilitation    Complete by:  As directed    Diagnosis:   Coronary Stents PTCA STEMI     Call MD for:  redness, tenderness, or signs of infection (pain, swelling, redness, odor or green/yellow discharge around incision site)    Complete by:  As directed    Diet - low sodium heart healthy    Complete by:  As directed    Discharge instructions    Complete by:  As directed    Radial Site Care Refer to this sheet in the next few weeks. These instructions provide you with information on caring for yourself after your procedure. Your caregiver may also give you more specific instructions. Your treatment has been planned  according to current medical practices, but problems sometimes occur. Call your caregiver if you have any problems or questions after your procedure. HOME CARE INSTRUCTIONS You may shower the day after the procedure.Remove the bandage (dressing) and gently wash the site with plain soap and water.Gently pat the site dry.  Do not apply powder or lotion to the site.  Do not submerge the affected site in water for 3 to 5 days.  Inspect the site at least twice daily.  Do not flex or bend the affected arm for 24 hours.  No lifting over 5 pounds (2.3 kg) for 5 days after your procedure.  Do  not drive home if you are discharged the same day of the procedure. Have someone else drive you.  You may drive 24 hours after the procedure unless otherwise instructed by your caregiver.  What to expect: Any bruising will usually fade within 1 to 2 weeks.  Blood that collects in the tissue (hematoma) may be painful to the touch. It should usually decrease in size and tenderness within 1 to 2 weeks.  SEEK IMMEDIATE MEDICAL CARE IF: You have unusual pain at the radial site.  You have redness, warmth, swelling, or pain at the radial site.  You have drainage (other than a small amount of blood on the dressing).  You have chills.  You have a fever or persistent symptoms for more than 72 hours.  You have a fever and your symptoms suddenly get worse.  Your arm becomes pale, cool, tingly, or numb.  You have heavy bleeding from the site. Hold pressure on the site.   Increase activity slowly    Complete by:  As directed       Discharge Medications   Current Discharge Medication List    START taking these medications   Details  aspirin 81 MG chewable tablet Chew 1 tablet (81 mg total) by mouth daily.    atorvastatin (LIPITOR) 80 MG tablet Take 1 tablet (80 mg total) by mouth daily at 6 PM. Qty: 90 tablet, Refills: 0    carvedilol (COREG) 3.125 MG tablet Take 1 tablet (3.125 mg total) by mouth 2 (two) times  daily with a meal. Qty: 60 tablet, Refills: 2    lisinopril (PRINIVIL,ZESTRIL) 2.5 MG tablet Take 1 tablet (2.5 mg total) by mouth daily. Qty: 90 tablet, Refills: 0    nitroGLYCERIN (NITROSTAT) 0.4 MG SL tablet Place 1 tablet (0.4 mg total) under the tongue every 5 (five) minutes as needed. Qty: 25 tablet, Refills: 3    omega-3 acid ethyl esters (LOVAZA) 1 g capsule Take 2 capsules (2 g total) by mouth daily. Qty: 60 capsule, Refills: 2    ticagrelor (BRILINTA) 90 MG TABS tablet Take 1 tablet (90 mg total) by mouth 2 (two) times daily. Qty: 60 tablet, Refills: 10      STOP taking these medications     ibuprofen (ADVIL,MOTRIN) 200 MG tablet          Aspirin prescribed at discharge?  Yes High Intensity Statin Prescribed? (Lipitor 40-80mg  or Crestor 20-40mg ): Yes Beta Blocker Prescribed? Yes For EF <40%, was ACEI/ARB Prescribed? Yes ADP Receptor Inhibitor Prescribed? (i.e. Plavix etc.-Includes Medically Managed Patients): Yes For EF <40%, Aldosterone Inhibitor Prescribed? Yes Was EF assessed during THIS hospitalization? Yes Was Cardiac Rehab II ordered? (Included Medically managed Patients): Yes   Outstanding Labs/Studies   FLP/LFTs in 6 weeks if tolerating statin  Duration of Discharge Encounter   Greater than 30 minutes including physician time.  Signed, Laverda Page NP-C 04/07/2017, 1:03 PM    Patient seen and examined. Agree with assessment and plan. Day 2 s/p inferior STEMI/DES stent to RCA. Pk troponin 4.64. Now on DAPT/coreg/ lisinopril/statin. Increase meds as tolerated.  OK for dc today   Lennette Bihari, MD, [facility] 04/07/2017 1:03 PM

## 2017-04-08 ENCOUNTER — Telehealth (HOSPITAL_COMMUNITY): Payer: Self-pay

## 2017-04-08 NOTE — Telephone Encounter (Signed)
Patient insurance is active and benefits verified. Patient has BCBS - no co-payment, deductible $3000/$434.84 has been met, out of pocket $3500/$434.84 has been met, 20% co-insurance, no pre-authorization and no limit on visit. Passport/reference [phone].  Patient will be contacted and scheduled after their follow up appointment with the cardiologist on 04/20/17, upon review by [facility] RN navigator.

## 2017-04-11 NOTE — Telephone Encounter (Signed)
Patient [facility]2017 Patient understands to follow up with provider Dr. Katrinka BlazingSmith on 04/20/2017 at [facility] [address] [facility]. Patient understands discharge instructions? yes Patient understands medications and regiment? yes Patient understands to bring all medications to this visit? yes  Pt states he has been doing well.  Pt denies chest pain.  States he has been taking his medication as instructed.  Will bring medications to f/u visit.

## 2017-04-15 ENCOUNTER — Ambulatory Visit (INDEPENDENT_AMBULATORY_CARE_PROVIDER_SITE_OTHER): Payer: BLUE CROSS/BLUE SHIELD | Admitting: Family Medicine

## 2017-04-15 ENCOUNTER — Encounter: Payer: Self-pay | Admitting: Family Medicine

## 2017-04-15 VITALS — BP 100/60 | HR 74 | Temp 99.0°F | Resp 18 | Ht 65.25 in | Wt 160.0 lb

## 2017-04-15 DIAGNOSIS — Z720 Tobacco use: Secondary | ICD-10-CM | POA: Diagnosis not present

## 2017-04-15 DIAGNOSIS — I739 Peripheral vascular disease, unspecified: Secondary | ICD-10-CM

## 2017-04-15 DIAGNOSIS — I2510 Atherosclerotic heart disease of native coronary artery without angina pectoris: Secondary | ICD-10-CM

## 2017-04-15 DIAGNOSIS — I8390 Asymptomatic varicose veins of unspecified lower extremity: Secondary | ICD-10-CM | POA: Diagnosis not present

## 2017-04-15 DIAGNOSIS — E782 Mixed hyperlipidemia: Secondary | ICD-10-CM | POA: Diagnosis not present

## 2017-04-15 DIAGNOSIS — K5900 Constipation, unspecified: Secondary | ICD-10-CM

## 2017-04-15 HISTORY — DX: Constipation, unspecified: K59.00

## 2017-04-15 NOTE — Assessment & Plan Note (Signed)
counseled on cessation 

## 2017-04-15 NOTE — Assessment & Plan Note (Signed)
On statin drug  He is going to think about preventative services such as colonoscopy immunizations he will follow-up in the fall and we'll see if he's got to proceed with having anything done.

## 2017-04-15 NOTE — Assessment & Plan Note (Signed)
Trial of MiraLAX 1 capful daily for constipation.

## 2017-04-15 NOTE — Patient Instructions (Addendum)
Miralax 1 capfull daily  F/U 4 months Physical

## 2017-04-15 NOTE — Progress Notes (Signed)
Subjective:    Patient ID: Alexander Larson, male    DOB: 01/15/1961, 56 y.o.   MRN: [id number]  Patient presents for New Patient (Initial Visit) and Hospitalization Follow-up  Pt here to establish care. He is originally from [place]. He has never had a primary care provider states he's not gets sick. He was recently admitted secondary to myocardial infarction which required stent placement he is found to have hyperlipidemia mostly with his triglycerides. He is currently on Brillinta, lisinopril Coreg aspirin Lipitor.  He has no known past medical history. With the exception of varicose veins which she states when his family. He's had increased pain and swelling in the veins in his left leg if he walks for a few hours he will get swelling and pain speak will be cold and painful as well. He would like to have this evaluated states his sister and mother both had surgeries to treat this.  He admits to smoking but states only small amounts and usually cigars. He is also cut back his alcohol to the chart note alcohol abuse.  He's never had colonoscopy, he states that he did have a tetanus booster in the past but he does not want any immunizations today.  He's had some difficulty moving his bowels the past few days. States he occasionally gets constipated on and off for was not sure what to take. Review Of Systems:  GEN- denies fatigue, fever, weight loss,weakness, recent illness HEENT- denies eye drainage, change in vision, nasal discharge, CVS- denies chest pain, palpitations RESP- denies SOB, cough, wheeze ABD- denies N/V, change in stools, abd pain GU- denies dysuria, hematuria, dribbling, incontinence MSK- denies joint pain, muscle aches, injury Neuro- denies headache, dizziness, syncope, seizure activity       Objective:    BP 100/60   Pulse 74   Temp 99 F (37.2 C) (Oral)   Resp 18   Ht 5' 5.25" (1.657 m)   Wt 160 lb (72.6 kg)   SpO2 97%   BMI 26.42 kg/m  GEN- NAD, alert and  oriented x3 HEENT- PERRL, EOMI, non injected sclera, pink conjunctiva, MMM, oropharynx clear Neck- Supple, no thyromegaly CVS- RRR, no murmur RESP-CTAB ABD-NABS,soft,NT,ND Psych- normal affect and mood  EXT- large varicose veins L>R non tender, some tortuous Pulses- Radial, DP- 2+        Assessment & Plan:      Problem List Items Addressed This Visit    Tobacco abuse    counseled on cessation      Mixed hyperlipidemia    On statin drug  He is going to think about preventative services such as colonoscopy immunizations he will follow-up in the fall and we'll see if he's got to proceed with having anything done.      Constipation    Trial of MiraLAX 1 capful daily for constipation.      CAD (coronary artery disease), native coronary artery    Continue current medications blood pressures controlled he'll follow-up with cardiology.  Concern with his tortuous varicose veins that he has some vascular compromise is that she when he is up on his feet. He is also in his family history.Will send  to vascular to be evaluated       Other Visit Diagnoses    Varicose vein of leg    -  Primary   Peripheral vascular disease [facility](HCC)          Note: This dictation was prepared with Dragon dictation along with smaller phrase technology.  Any transcriptional errors that result from this process are unintentional.

## 2017-04-15 NOTE — Assessment & Plan Note (Signed)
Continue current medications blood pressures controlled he'll follow-up with cardiology.  Concern with his tortuous varicose veins that he has some vascular compromise is that she when he is up on his feet. He is also in his family history.Will send  to vascular to be evaluated

## 2017-04-19 NOTE — Progress Notes (Signed)
Cardiology Office Note    Date:  04/20/2017   ID:  Alexander Larson, DOB 05/06/1961, MRN [id number]  PCP:  Salley Scarlet, MD  Cardiologist: Lesleigh Noe, MD   Chief Complaint  Patient presents with  . Coronary Artery Disease    History of Present Illness:  Alexander Larson is a 56 y.o. male who suffered an acute inferior myocardial infarction on 04/05/2017 and had acute intervention on the right coronary with total occlusion time less than 1.5 hours. Myocardial impairment was minimal and enzyme release was low.  Since discharge from the hospital Alexander Larson is done well. He states he feels somewhat lethargic and most to sleep all the time. His had no recurrence of chest discomfort. He wonders if medications are partially responsible for his lethargy. He has not had palpitations or syncope. Overall he feels quite well.  Past Medical History:  Diagnosis Date  . Acute ST elevation myocardial infarction (STEMI) of inferior wall (HCC) 04/05/2017  . Alcohol abuse 04/05/2017  . CAD (coronary artery disease)    04/05/17 PCI DES--RCA, EF 55%  . CAD (coronary artery disease), native coronary artery 04/07/2017  . Constipation 04/15/2017  . Hyperlipidemia   . Mixed hyperlipidemia   . Status post coronary artery stent placement   . STEMI (ST elevation myocardial infarction) (HCC) 04/05/2017  . Tobacco abuse 04/05/2017  . Tobacco use     Past Surgical History:  Procedure Laterality Date  . CORONARY STENT INTERVENTION N/A 04/05/2017   Procedure: Coronary Stent Intervention;  Surgeon: Lyn Records, MD;  Location: [facility] INVASIVE CV LAB;  Service: Cardiovascular;  Laterality: N/A;  . LEFT HEART CATH AND CORONARY ANGIOGRAPHY N/A 04/05/2017   Procedure: Left Heart Cath and Coronary Angiography;  Surgeon: Lyn Records, MD;  Location: [facility] INVASIVE CV LAB;  Service: Cardiovascular;  Laterality: N/A;    Current Medications: Outpatient Medications Prior to Visit  Medication Sig Dispense Refill  .  aspirin 81 MG chewable tablet Chew 1 tablet (81 mg total) by mouth daily.    Marland Kitchen atorvastatin (LIPITOR) 80 MG tablet Take 1 tablet (80 mg total) by mouth daily at 6 PM. 90 tablet 0  . lisinopril (PRINIVIL,ZESTRIL) 2.5 MG tablet Take 1 tablet (2.5 mg total) by mouth daily. 90 tablet 0  . nitroGLYCERIN (NITROSTAT) 0.4 MG SL tablet Place 1 tablet (0.4 mg total) under the tongue every 5 (five) minutes as needed. 25 tablet 3  . omega-3 acid ethyl esters (LOVAZA) 1 g capsule Take 2 capsules (2 g total) by mouth daily. 60 capsule 2  . carvedilol (COREG) 3.125 MG tablet Take 1 tablet (3.125 mg total) by mouth 2 (two) times daily with a meal. 60 tablet 2  . ticagrelor (BRILINTA) 90 MG TABS tablet Take 1 tablet (90 mg total) by mouth 2 (two) times daily. 60 tablet 10   No facility-administered medications prior to visit.      Allergies:   Patient has no known allergies.   Social History   Social History  . Marital status: Married    Spouse name: N/A  . Number of children: N/A  . Years of education: N/A   Social History Main Topics  . Smoking status: Current Some Day Smoker    Types: Cigarettes  . Smokeless tobacco: Current User    Types: Chew, Snuff    Last attempt to quit: 04/06/2017  . Alcohol use 0.6 oz/week    1 Cans of beer per week     Comment: 1 a  day  . Drug use: No  . Sexual activity: Not Asked   Other Topics Concern  . None   Social History Narrative  . None     Family History:  The patient's family history includes Hypertension in his father.   ROS:   Please see the history of present illness.    Lethargy and excessive daytime sleepiness.  All other systems reviewed and are negative.   PHYSICAL EXAM:   VS:  BP 126/64 (BP Location: Left Arm)   Pulse 70   Ht 5\' 5"  (1.651 m)   Wt 158 lb 12.8 oz (72 kg)   BMI 26.43 kg/m    GEN: Well nourished, well developed, in no acute distress  HEENT: normal  Neck: no JVD, carotid bruits, or masses Cardiac: RRR; no murmurs,  rubs, or gallops,no edema  Respiratory:  clear to auscultation bilaterally, normal work of breathing GI: soft, nontender, nondistended, + BS MS: no deformity or atrophy  Skin: warm and dry, no rash Neuro:  Alert and Oriented x 3, Strength and sensation are intact Psych: euthymic mood, full affect  Wt Readings from Last 3 Encounters:  04/20/17 158 lb 12.8 oz (72 kg)  04/15/17 160 lb (72.6 kg)  04/06/17 160 lb 15 oz (73 kg)      Studies/Labs Reviewed:   EKG:  EKG  Not repeated  Recent Labs: 04/05/2017: ALT 32; B Natriuretic Peptide 38.1; Hemoglobin 14.4; Magnesium 2.1; Platelets 190 04/07/2017: BUN 14; Creatinine, Ser 0.88; Potassium 3.8; Sodium 134   Lipid Panel    Component Value Date/Time   CHOL 198 04/05/2017 0803   TRIG 302 (H) 04/05/2017 0803   HDL 32 (L) 04/05/2017 0803   CHOLHDL 6.2 04/05/2017 0803   VLDL 60 (H) 04/05/2017 0803   LDLCALC 106 (H) 04/05/2017 0803    Additional studies/ records that were reviewed today include:  Cardiac catheterization with PCI 04/05/2017  Coronary Diagrams   Diagnostic Diagram       Post-Intervention Diagram          ASSESSMENT:    1. Acute ST elevation myocardial infarction (STEMI) of inferior wall (HCC)   2. Coronary artery disease involving native coronary artery of native heart without angina pectoris   3. Tobacco abuse   4. Mixed hyperlipidemia      PLAN:  In order of problems listed above:  1. He has done well since suffering an acute inferior wall myocardial infarction treated with PCI promptly on 04/05/2017. No residual other disease was noted. Acute LV function revealed EF 55%. Plan to wean and DC carvedilol. On return in 2 months we will consider discontinuation of lisinopril. Also because of cost we will switch him Plavix 75 mg per day and stop Brilinta. 2. No recurrence of angina. He was never seen by cardiac rehabilitation during the hospital stay. We will refer him to phase 2 cardiac  rehabilitation. 3. Counseled to continue smoking cessation. 4. He is on high intensity statin therapy currently. A statin panel will be obtained in 4 weeks. Medication intensity will be reduced at that time most likely as his cholesterol levels when checked on admission revealed a total cholesterol of 198 and LDL of 106 pretreatment.  Extended office visit with greater than 50% of the time spent in face-to-face counseling concerning management, return to work, indication for medication strategy, and expected long-term outcome.  Medication Adjustments/Labs and Tests Ordered: Current medicines are reviewed at length with the patient today.  Concerns regarding medicines are outlined above.  Medication changes, Labs and Tests ordered today are listed in the Patient Instructions below. Patient Instructions  Medication Instructions:  1) TAKE Carvedilol one time daily for a week and then discontinue  Labwork: Your physician recommends that you return for lab work  When you are fasting (nothing to eat or drink after midnight the night before) Lipid and liver   Testing/Procedures: None  Follow-Up: Your physician recommends that you schedule a follow-up appointment in: 4 months with Dr. Katrinka BlazingSmith.    Any Other Special Instructions Will Be Listed Below (If Applicable).  You have been referred to Phase II of Cardiac Rehab.   If you need a refill on your cardiac medications before your next appointment, please call your pharmacy.      Signed, Lesleigh NoeHenry W Jemel Ono III, MD  04/20/2017 4:27 PM    [facility] [address], [place], [place]  [address] Phone: [phone]; Fax: [phone]

## 2017-04-20 ENCOUNTER — Encounter (INDEPENDENT_AMBULATORY_CARE_PROVIDER_SITE_OTHER): Payer: Self-pay

## 2017-04-20 ENCOUNTER — Ambulatory Visit (INDEPENDENT_AMBULATORY_CARE_PROVIDER_SITE_OTHER): Payer: BLUE CROSS/BLUE SHIELD | Admitting: Interventional Cardiology

## 2017-04-20 ENCOUNTER — Encounter: Payer: Self-pay | Admitting: *Deleted

## 2017-04-20 ENCOUNTER — Encounter: Payer: Self-pay | Admitting: Interventional Cardiology

## 2017-04-20 VITALS — BP 126/64 | HR 70 | Ht 65.0 in | Wt 158.8 lb

## 2017-04-20 DIAGNOSIS — Z720 Tobacco use: Secondary | ICD-10-CM | POA: Diagnosis not present

## 2017-04-20 DIAGNOSIS — I2510 Atherosclerotic heart disease of native coronary artery without angina pectoris: Secondary | ICD-10-CM | POA: Diagnosis not present

## 2017-04-20 DIAGNOSIS — I2119 ST elevation (STEMI) myocardial infarction involving other coronary artery of inferior wall: Secondary | ICD-10-CM | POA: Diagnosis not present

## 2017-04-20 DIAGNOSIS — E782 Mixed hyperlipidemia: Secondary | ICD-10-CM

## 2017-04-20 MED ORDER — CLOPIDOGREL BISULFATE 75 MG PO TABS: 75.0000 mg | ORAL_TABLET | Freq: Every day | ORAL | 3 refills | Status: DC

## 2017-04-20 NOTE — Patient Instructions (Addendum)
Medication Instructions:  1) TAKE Carvedilol one time daily for a week and then discontinue 2) Once you have used up all of your Brilinta, START Plavix 75mg  once daily.  Make sure to take your first dose of Plavix along with your last dose of Brilinta.   Labwork: Your physician recommends that you return for lab work  When you are fasting (nothing to eat or drink after midnight the night before) Lipid and liver   Testing/Procedures: None  Follow-Up: Your physician recommends that you schedule a follow-up appointment in: 4 months with Dr. Katrinka BlazingSmith.    Any Other Special Instructions Will Be Listed Below (If Applicable).  You have been referred to Phase II of Cardiac Rehab.   If you need a refill on your cardiac medications before your next appointment, please call your pharmacy.

## 2017-04-22 ENCOUNTER — Other Ambulatory Visit: Payer: BLUE CROSS/BLUE SHIELD | Admitting: *Deleted

## 2017-04-22 DIAGNOSIS — I2510 Atherosclerotic heart disease of native coronary artery without angina pectoris: Secondary | ICD-10-CM

## 2017-04-22 LAB — HEPATIC FUNCTION PANEL
ALT: 24 IU/L (ref 0–44)
AST: 21 IU/L (ref 0–40)
Albumin: 4.3 g/dL (ref 3.5–5.5)
Alkaline Phosphatase: 80 IU/L (ref 39–117)
BILIRUBIN TOTAL: 0.7 mg/dL (ref 0.0–1.2)
BILIRUBIN, DIRECT: 0.18 mg/dL (ref 0.00–0.40)
Total Protein: 6.6 g/dL (ref 6.0–8.5)

## 2017-04-22 LAB — LIPID PANEL
CHOLESTEROL TOTAL: 122 mg/dL (ref 100–199)
Chol/HDL Ratio: 4.4 ratio (ref 0.0–5.0)
HDL: 28 mg/dL — ABNORMAL LOW (ref >39)
LDL Calculated: 73 mg/dL (ref 0–99)
TRIGLYCERIDES: 107 mg/dL (ref 0–149)
VLDL Cholesterol Cal: 21 mg/dL (ref 5–40)

## 2017-04-25 ENCOUNTER — Telehealth: Payer: Self-pay | Admitting: Interventional Cardiology

## 2017-04-25 NOTE — Telephone Encounter (Signed)
Informed pt of lab results. Pt verbalized understanding. 

## 2017-04-25 NOTE — Telephone Encounter (Signed)
Patient returning your call, thanks. °

## 2017-04-29 ENCOUNTER — Telehealth (HOSPITAL_COMMUNITY): Payer: Self-pay

## 2017-04-29 NOTE — Telephone Encounter (Signed)
I called and left message on patient voicemail to call office about scheduling for cardiac rehab. I left office contact information on patient voicemail to return call.  ° °

## 2017-05-03 ENCOUNTER — Encounter: Payer: Self-pay | Admitting: Vascular Surgery

## 2017-05-03 ENCOUNTER — Ambulatory Visit (INDEPENDENT_AMBULATORY_CARE_PROVIDER_SITE_OTHER): Payer: BLUE CROSS/BLUE SHIELD | Admitting: Vascular Surgery

## 2017-05-03 VITALS — BP 119/75 | HR 65 | Temp 98.8°F | Resp 16 | Ht 66.0 in | Wt 161.0 lb

## 2017-05-03 DIAGNOSIS — I83893 Varicose veins of bilateral lower extremities with other complications: Secondary | ICD-10-CM | POA: Diagnosis not present

## 2017-05-03 NOTE — Progress Notes (Signed)
Subjective:     Patient ID: Alexander Larson, male   DOB: 05-29-1961, 56 y.o.   MRN: [id number]  HPI This 56 year old male is referred by Dr. Jeanice Lim from [facility] for evaluation of bilateral painful varicose veins. Patient has very prominent veins in both legs more so on the left than the right. He has been developing an aching heavy throbbing discomfort particularly in the lateral aspect of the left calf when he is on his feet for long periods. His elastic compression stockings nor elevate his legs. He did suffer a myocardial infarction in June 2018 and had a stent placed at that time and he is on anticoagulation currently and is followed by Dr. Verdis Prime. He denies claudication type symptoms. He has no history of DVT thrombophlebitis stasis ulcers or bleeding.  Past Medical History:  Diagnosis Date  . Acute ST elevation myocardial infarction (STEMI) of inferior wall (HCC) 04/05/2017  . Alcohol abuse 04/05/2017  . CAD (coronary artery disease)    04/05/17 PCI DES--RCA, EF 55%  . CAD (coronary artery disease), native coronary artery 04/07/2017  . Constipation 04/15/2017  . Hyperlipidemia   . Mixed hyperlipidemia   . Status post coronary artery stent placement   . STEMI (ST elevation myocardial infarction) (HCC) 04/05/2017  . Tobacco abuse 04/05/2017  . Tobacco use   . Varicose veins of both lower extremities with pain     Social History  Substance Use Topics  . Smoking status: Current Some Day Smoker    Types: Cigarettes  . Smokeless tobacco: Current User    Types: Chew, Snuff    Last attempt to quit: 04/06/2017  . Alcohol use 0.6 oz/week    1 Cans of beer per week     Comment: 1 a day    Family History  Problem Relation Age of Onset  . Hypertension Father     No Known Allergies   Current Outpatient Prescriptions:  .  aspirin 81 MG chewable tablet, Chew 1 tablet (81 mg total) by mouth daily., Disp: , Rfl:  .  atorvastatin (LIPITOR) 80 MG tablet, Take 1 tablet  (80 mg total) by mouth daily at 6 PM., Disp: 90 tablet, Rfl: 0 .  BRILINTA 90 MG TABS tablet, TAKE 1 TABLET (90 MG TOTAL) BY MOUTH 2 (TWO) TIMES DAILY., Disp: , Rfl: 10 .  carvedilol (COREG) 3.125 MG tablet, TAKE 1 TABLET (3.125 MG TOTAL) BY MOUTH 2 (TWO) TIMES DAILY WITH A MEAL., Disp: , Rfl: 2 .  clopidogrel (PLAVIX) 75 MG tablet, Take 1 tablet (75 mg total) by mouth daily., Disp: 90 tablet, Rfl: 3 .  lisinopril (PRINIVIL,ZESTRIL) 2.5 MG tablet, Take 1 tablet (2.5 mg total) by mouth daily., Disp: 90 tablet, Rfl: 0 .  omega-3 acid ethyl esters (LOVAZA) 1 g capsule, Take 2 capsules (2 g total) by mouth daily., Disp: 60 capsule, Rfl: 2 .  nitroGLYCERIN (NITROSTAT) 0.4 MG SL tablet, Place 1 tablet (0.4 mg total) under the tongue every 5 (five) minutes as needed. (Patient not taking: Reported on 05/03/2017), Disp: 25 tablet, Rfl: 3  Vitals:   05/03/17 1459  BP: 119/75  Pulse: 65  Resp: 16  Temp: 98.8 F (37.1 C)  SpO2: 99%  Weight: 161 lb (73 kg)  Height: 5\' 6"  (1.676 m)    Body mass index is 25.99 kg/m.         Review of Systems See history of present illness-denies chest pain, dyspnea on exertion, PND, orthopnea, hemoptysis. Does have history of  hyperlipidemia, had STEMI-right coronary artery with stent on 04/05/2017-currently on Plavix-and Brilinta other systems negative and complete review of systems    Objective:   Physical Exam BP 119/75 (BP Location: Left Arm, Patient Position: Sitting, Cuff Size: Normal)   Pulse 65   Temp 98.8 F (37.1 C)   Resp 16   Ht 5\' 6"  (1.676 m)   Wt 161 lb (73 kg)   SpO2 99%   BMI 25.99 kg/m     Gen.-alert and oriented x3 in no apparent distress HEENT normal for age Lungs no rhonchi or wheezing Cardiovascular regular rhythm no murmurs carotid pulses 3+ palpable no bruits audible Abdomen soft nontender no palpable masses Musculoskeletal free of  major deformities Skin clear -no rashes Neurologic normal Lower extremities 3+ femoral and  dorsalis pedis pulses palpable bilaterally with no edema Left leg with bulging varicosities in the medial distal thigh over the great saphenous vein and extending laterally into the lateral anterior compartment across the pretibial area. No distal edema noted today. No hyperpigmentation or ulceration noted today. Right leg with cluster of bulging varicosities in the medial calf over the great saphenous vein with no ulceration or hyperpigmentation noted.  Lab performed a bedside sono site ultrasound exam. The left great saphenous vein is enlarged throughout and has gross reflux The right great saphenous vein is a smaller vein but does have some reflux      Assessment:     #1 painful varicosities left greater than right due to gross reflux left great saphenous vein-affecting patient's daily living #2 status post STEMI-RCA-stent placed-patient currently on Plavix and Brilinta-04/05/2017 #3 hyperlipidemia #4 ongoing tobacco abuse    Plan:         #1 long leg elastic compression stockings 20-30 mm gradient #2 elevate legs as much as possible #3 ibuprofen daily on a regular basis for pain #4 return in 3 months-he will have formal venous reflux exam of both legs upon return in recommendations will be made He likely will need laser ablation left great saphenous vein plus multiple stab phlebectomy of painful varicosities Will likely not recommend treatment of right great saphenous vein since patient has coronary artery disease and preserve this as potential conduit or coronary artery bypass grafting if ever indicated Return in 3 months

## 2017-05-04 NOTE — Addendum Note (Signed)
Addended by: Burton ApleyPETTY, Dajohn Ellender A on: 05/04/2017 11:57 AM   Modules accepted: Orders

## 2017-05-19 ENCOUNTER — Encounter (HOSPITAL_COMMUNITY): Payer: Self-pay

## 2017-05-19 ENCOUNTER — Telehealth (HOSPITAL_COMMUNITY): Payer: Self-pay

## 2017-05-19 NOTE — Telephone Encounter (Signed)
I called and left message on patient voicemail to call office about scheduling for cardiac rehab. I left office contact information on patient voicemail to return call. I have mailed patient a letter with information about cardiac rehab. °

## 2017-05-24 ENCOUNTER — Other Ambulatory Visit: Payer: BLUE CROSS/BLUE SHIELD

## 2017-07-05 ENCOUNTER — Other Ambulatory Visit: Payer: Self-pay | Admitting: Interventional Cardiology

## 2017-08-04 ENCOUNTER — Other Ambulatory Visit: Payer: Self-pay | Admitting: Interventional Cardiology

## 2017-08-04 MED ORDER — OMEGA-3-ACID ETHYL ESTERS 1 G PO CAPS: 2.0000 | ORAL_CAPSULE | Freq: Every day | ORAL | 2 refills | Status: DC

## 2017-08-10 ENCOUNTER — Encounter: Payer: Self-pay | Admitting: Interventional Cardiology

## 2017-08-16 ENCOUNTER — Ambulatory Visit (INDEPENDENT_AMBULATORY_CARE_PROVIDER_SITE_OTHER): Payer: BLUE CROSS/BLUE SHIELD | Admitting: Vascular Surgery

## 2017-08-16 ENCOUNTER — Ambulatory Visit (HOSPITAL_COMMUNITY)
Admission: RE | Admit: 2017-08-16 | Discharge: 2017-08-16 | Disposition: A | Discharge status: Home / Self Care | Payer: BLUE CROSS/BLUE SHIELD | Source: Ambulatory Visit | Attending: Vascular Surgery | Admitting: Vascular Surgery

## 2017-08-16 ENCOUNTER — Encounter: Payer: Self-pay | Admitting: Vascular Surgery

## 2017-08-16 VITALS — BP 132/82 | HR 69 | Temp 100.3°F | Resp 16 | Ht 67.72 in | Wt 165.0 lb

## 2017-08-16 DIAGNOSIS — I83893 Varicose veins of bilateral lower extremities with other complications: Secondary | ICD-10-CM

## 2017-08-16 NOTE — Progress Notes (Signed)
Subjective:     Patient ID: Alexander Larson, male   DOB: 08/11/1961, 56 y.o.   MRN: [id number]  HPI This 56 year old male returns for continued follow-up regarding his painful varicose veins left worsen right. He has tried long leg elastic compression stockings 20-30 millimeter gradient as well as elevation and ibuprofen but continues to have symptoms which are not relieved by these conservative measures. He does have history of coronary artery disease having suffered a myocardial infarction in June 2018 is currently on Plavix and aspirin. He denies any chest pain dyspnea on exertion PND orthopnea or hemoptysis. He develops aching throbbing and burning discomfort in the left leg which worsens as the day progresses. He does want treatment of the veins in the left leg which are affecting his daily living.  Past Medical History:  Diagnosis Date  . Acute ST elevation myocardial infarction (STEMI) of inferior wall (HCC) 04/05/2017  . Alcohol abuse 04/05/2017  . CAD (coronary artery disease)    04/05/17 PCI DES--RCA, EF 55%  . CAD (coronary artery disease), native coronary artery 04/07/2017  . Constipation 04/15/2017  . Hyperlipidemia   . Mixed hyperlipidemia   . Status post coronary artery stent placement   . STEMI (ST elevation myocardial infarction) (HCC) 04/05/2017  . Tobacco abuse 04/05/2017  . Tobacco use   . Varicose veins of both lower extremities with pain     Social History  Substance Use Topics  . Smoking status: Current Some Day Smoker    Types: Cigarettes  . Smokeless tobacco: Current User    Types: Chew, Snuff    Last attempt to quit: 04/06/2017  . Alcohol use 0.6 oz/week    1 Cans of beer per week     Comment: 1 a day    Family History  Problem Relation Age of Onset  . Hypertension Father     No Known Allergies   Current Outpatient Prescriptions:  .  aspirin 81 MG chewable tablet, Chew 1 tablet (81 mg total) by mouth daily., Disp: , Rfl:  .  atorvastatin (LIPITOR) 80 MG  tablet, TAKE 1 TABLET (80 MG TOTAL) BY MOUTH DAILY AT 6 PM., Disp: 90 tablet, Rfl: 0 .  carvedilol (COREG) 3.125 MG tablet, TAKE 1 TABLET (3.125 MG TOTAL) BY MOUTH 2 (TWO) TIMES DAILY WITH A MEAL., Disp: , Rfl: 2 .  clopidogrel (PLAVIX) 75 MG tablet, Take 1 tablet (75 mg total) by mouth daily., Disp: 90 tablet, Rfl: 3 .  lisinopril (PRINIVIL,ZESTRIL) 2.5 MG tablet, TAKE 1 TABLET BY MOUTH EVERY DAY, Disp: 90 tablet, Rfl: 0 .  nitroGLYCERIN (NITROSTAT) 0.4 MG SL tablet, Place 1 tablet (0.4 mg total) under the tongue every 5 (five) minutes as needed., Disp: 25 tablet, Rfl: 3 .  omega-3 acid ethyl esters (LOVAZA) 1 g capsule, Take 2 capsules (2 g total) by mouth daily., Disp: 180 capsule, Rfl: 2 .  BRILINTA 90 MG TABS tablet, TAKE 1 TABLET (90 MG TOTAL) BY MOUTH 2 (TWO) TIMES DAILY., Disp: , Rfl: 10  Vitals:   08/16/17 1548  BP: 132/82  Pulse: 69  Resp: 16  Temp: 100.3 F (37.9 C)  SpO2: 98%  Weight: 165 lb (74.8 kg)  Height: 5' 7.72" (1.72 m)    Body mass index is 25.3 kg/m.         Review of Systems Denies chest pain, dyspnea on exertion, PND, orthopnea, hemoptysis    Objective:   Physical Exam BP 132/82 (BP Location: Left Arm, Patient Position: Sitting, Cuff Size: Normal)  Pulse 69   Temp 100.3 F (37.9 C)   Resp 16   Ht 5' 7.72" (1.72 m)   Wt 165 lb (74.8 kg)   SpO2 98%   BMI 25.30 kg/m   Gen. well-developed well-nourished male no apparent distress alert nor in 3 Lungs no rhonchi or wheezing Left leg with bulging varicosities along the medial thigh distally and into the medial and lateral calf and pretibial region with trace distal edema. No hyperpigmentation or ulceration noted. Right leg with bulging varicosities in the medial calf of the great saphenous vein  Today I ordered bilateral venous duplex exam chart reviewed and interpreted. There is gross reflux throughout both great saphenous veins in both are significantly enlarged in caliber       Assessment:      #1 bilateral painful varicosities left greater than right due to gross reflux and large great saphenous veins bilaterally. These are causing symptoms which are affecting patient's daily living and not relieved by conservative measures including long-leg elastic compression stockings, elevation, and ibuprofen.    Plan:     #1 would recommend proceeding with laser ablation left great saphenous vein 6 months after his myocardial infarction occurred which is June 2018 plus greater than 20 stab phlebectomy of painful varicosities on the left leg #2 will not recommend laser ablation of right great saphenous vein because of patient's significant history of coronary artery disease with previous myocardial infarction 4 months ago. If symptoms worsen over time he may need that side done but currently will leave this is a potential conduit for coronary artery bypass grafting in the future  Patient will be seeing his cardiologist Dr. Verdis Prime in the near future and we'll discuss these plans. We'll tentatively plan for January 2019 to perform laser ablation of the left side following preapproval from the insurance company

## 2017-08-22 ENCOUNTER — Ambulatory Visit (INDEPENDENT_AMBULATORY_CARE_PROVIDER_SITE_OTHER): Payer: BLUE CROSS/BLUE SHIELD | Admitting: Family Medicine

## 2017-08-22 ENCOUNTER — Encounter: Payer: Self-pay | Admitting: Family Medicine

## 2017-08-22 VITALS — BP 132/82 | HR 80 | Temp 98.9°F | Resp 16 | Ht 67.5 in | Wt 161.0 lb

## 2017-08-22 DIAGNOSIS — Z125 Encounter for screening for malignant neoplasm of prostate: Secondary | ICD-10-CM

## 2017-08-22 DIAGNOSIS — Z1159 Encounter for screening for other viral diseases: Secondary | ICD-10-CM | POA: Diagnosis not present

## 2017-08-22 DIAGNOSIS — Z0000 Encounter for general adult medical examination without abnormal findings: Secondary | ICD-10-CM

## 2017-08-22 DIAGNOSIS — I2510 Atherosclerotic heart disease of native coronary artery without angina pectoris: Secondary | ICD-10-CM | POA: Diagnosis not present

## 2017-08-22 DIAGNOSIS — Z23 Encounter for immunization: Secondary | ICD-10-CM | POA: Diagnosis not present

## 2017-08-22 DIAGNOSIS — Z1211 Encounter for screening for malignant neoplasm of colon: Secondary | ICD-10-CM | POA: Diagnosis not present

## 2017-08-22 DIAGNOSIS — N529 Male erectile dysfunction, unspecified: Secondary | ICD-10-CM | POA: Insufficient documentation

## 2017-08-22 DIAGNOSIS — N521 Erectile dysfunction due to diseases classified elsewhere: Secondary | ICD-10-CM

## 2017-08-22 DIAGNOSIS — Z720 Tobacco use: Secondary | ICD-10-CM | POA: Diagnosis not present

## 2017-08-22 MED ORDER — SILDENAFIL CITRATE 100 MG PO TABS: 50.0000 mg | ORAL_TABLET | Freq: Every day | ORAL | 1 refills | Status: DC | PRN

## 2017-08-22 NOTE — Progress Notes (Signed)
Cardiology Office Note    Date:  08/23/2017   ID:  Alexander Chendward Davanzo, DOB 01/26/1961, MRN [id number]  PCP:  Salley Scarleturham, Kawanta F, MD  Cardiologist: Lesleigh NoeHenry W Tasha Jindra III, MD   Chief Complaint  Patient presents with  . Coronary Artery Disease    History of Present Illness:  Alexander Larson is a 56 y.o. male who suffered an acute inferior myocardial infarction on 04/05/2017 and had acute intervention on the right coronary with total occlusion time less than 1.5 hours. Myocardial impairment was minimal and enzyme release was low.  He is doing well.  He has not had angina.  No nitroglycerin use.  No medication side effects.  LDL cholesterol was performed 3 weeks after starting atorvastatin and LDL was 73.  He continues to smoke a cigarette from time to time.  He is back at full-time work without difficulty.   Past Medical History:  Diagnosis Date  . Acute ST elevation myocardial infarction (STEMI) of inferior wall (HCC) 04/05/2017  . Alcohol abuse 04/05/2017  . CAD (coronary artery disease)    04/05/17 PCI DES--RCA, EF 55%  . CAD (coronary artery disease), native coronary artery 04/07/2017  . Constipation 04/15/2017  . Hyperlipidemia   . Mixed hyperlipidemia   . Status post coronary artery stent placement   . STEMI (ST elevation myocardial infarction) (HCC) 04/05/2017  . Tobacco abuse 04/05/2017  . Tobacco use   . Varicose veins of both lower extremities with pain     Past Surgical History:  Procedure Laterality Date  . CORONARY STENT INTERVENTION N/A 04/05/2017   Procedure: Coronary Stent Intervention;  Surgeon: Lyn RecordsSmith, Oleva Koo W, MD;  [facility];  Service: Cardiovascular;  Laterality: N/A;  . LEFT HEART CATH AND CORONARY ANGIOGRAPHY N/A 04/05/2017   Procedure: Left Heart Cath and Coronary Angiography;  Surgeon: Lyn RecordsSmith, Ophie Burrowes W, MD;  [facility];  Service: Cardiovascular;  Laterality: N/A;    Current Medications: Outpatient Medications Prior to Visit    Medication Sig Dispense Refill  . aspirin 81 MG chewable tablet Chew 1 tablet (81 mg total) by mouth daily.    Marland Kitchen. atorvastatin (LIPITOR) 80 MG tablet TAKE 1 TABLET (80 MG TOTAL) BY MOUTH DAILY AT 6 PM. 90 tablet 0  . carvedilol (COREG) 3.125 MG tablet TAKE 1 TABLET (3.125 MG TOTAL) BY MOUTH 2 (TWO) TIMES DAILY WITH A MEAL.  2  . clopidogrel (PLAVIX) 75 MG tablet Take 1 tablet (75 mg total) by mouth daily. 90 tablet 3  . lisinopril (PRINIVIL,ZESTRIL) 2.5 MG tablet TAKE 1 TABLET BY MOUTH EVERY DAY 90 tablet 0  . nitroGLYCERIN (NITROSTAT) 0.4 MG SL tablet Place 1 tablet (0.4 mg total) under the tongue every 5 (five) minutes as needed. 25 tablet 3  . omega-3 acid ethyl esters (LOVAZA) 1 g capsule Take 2 capsules (2 g total) by mouth daily. 180 capsule 2  . sildenafil (VIAGRA) 100 MG tablet Take 0.5-1 tablets (50-100 mg total) by mouth daily as needed for erectile dysfunction. (Patient not taking: Reported on 08/23/2017) 6 tablet 1   No facility-administered medications prior to visit.      Allergies:   Patient has no known allergies.   Social History   Social History  . Marital status: Married    Spouse name: N/A  . Number of children: N/A  . Years of education: N/A   Social History Main Topics  . Smoking status: Current Some Day Smoker    Types: Cigarettes  . Smokeless tobacco:  Never Used  . Alcohol use 7.2 oz/week    12 Cans of beer per week     Comment: 1 a day  . Drug use: No  . Sexual activity: Not Currently   Other Topics Concern  . None   Social History Narrative  . None     Family History:  The patient'sfamily history includes Cancer in his father; Hypertension in his father.   ROS:   Please see the history of present illness.    No joint aches, blood in urine or stool, but is having easy bruising. All other systems reviewed and are negative.   PHYSICAL EXAM:   VS:  BP 130/64   Pulse 72   Ht 5\' 6"  (1.676 m)   Wt 159 lb 3.2 oz (72.2 kg)   BMI 25.70 kg/m     GEN: Well nourished, well developed, in no acute distress  HEENT: normal  Neck: no JVD, carotid bruits, or masses Cardiac: RRR; no murmurs, rubs, or gallops,no edema  Respiratory:  clear to auscultation bilaterally, normal work of breathing GI: soft, nontender, nondistended, + BS MS: no deformity or atrophy  Skin: warm and dry, no rash Neuro:  Alert and Oriented x 3, Strength and sensation are intact Psych: euthymic mood, full affect  Wt Readings from Last 3 Encounters:  08/23/17 159 lb 3.2 oz (72.2 kg)  08/22/17 161 lb (73 kg)  08/16/17 165 lb (74.8 kg)      Studies/Labs Reviewed:   EKG:  EKG not repeated  Recent Labs: 04/05/2017: B Natriuretic Peptide 38.1; Magnesium 2.1 08/22/2017: ALT 24; BUN 23; Creat 0.83; Hemoglobin 14.5; Platelets 193; Potassium 4.1; Sodium 138   Lipid Panel    Component Value Date/Time   CHOL 122 04/22/2017 0754   TRIG 107 04/22/2017 0754   HDL 28 (L) 04/22/2017 0754   CHOLHDL 4.4 04/22/2017 0754   CHOLHDL 6.2 04/05/2017 0803   VLDL 60 (H) 04/05/2017 0803   LDLCALC 73 04/22/2017 0754    Additional studies/ records that were reviewed today include:  Cardiac catheterization June 2018: Coronary Diagrams   Diagnostic Diagram       Post-Intervention Diagram           ASSESSMENT:    1. Coronary artery disease involving native coronary artery of native heart with angina pectoris (HCC)   2. Tobacco abuse   3. Mixed hyperlipidemia      PLAN:  In order of problems listed above:  1. He is doing well without recurrent angina after acute inferior myocardial infarction April 05, 2017.  He is back at full-time work without limitations.  We discussed his coronary anatomy, the implications of coronary stenting and requirement for dual antiplatelet therapy until June 2019, and risk factor modification. 2. Encouraged not to smoke.  He is not ready to set a stop date. 3. The most recent LDL was 73 on April 22, 2017, 3 weeks after atorvastatin.   Liver panel is unremarkable and blood work performed yesterday by Dr. Jeanice Lim.  Plan clinical follow-up in 6 months.  His liver and lipid panel will be done at that time.  Smoking cessation was encouraged.  Medication Adjustments/Labs and Tests Ordered: Current medicines are reviewed at length with the patient today.  Concerns regarding medicines are outlined above.  Medication changes, Labs and Tests ordered today are listed in the Patient Instructions below. There are no Patient Instructions on file for this visit.   Signed, Lesleigh Noe, MD  08/23/2017 8:22 AM  [facility] [place], [place], [place]  [address] Phone: [phone]; Fax: [phone]

## 2017-08-22 NOTE — Assessment & Plan Note (Signed)
F/u cardiology tomorrow Recent lipid at goal  He is not on long acting nitarte, so given Viagra he can take 1/2 tablet

## 2017-08-22 NOTE — Addendum Note (Signed)
Addended by: Phillips OdorSIX, Berkeley Vanaken H on: 08/22/2017 05:13 PM   Modules accepted: Orders

## 2017-08-22 NOTE — Patient Instructions (Addendum)
Tetantus Booster given  We will call with lab results  Check on colonoscopy cost - you have some blood in your stool  F/U 6 months

## 2017-08-22 NOTE — Progress Notes (Signed)
   Subjective:    Patient ID: Alexander Larson, male    DOB: 05/11/1961, [age] y.o.   MRN: [id number]  Patient presents for Annual Exam (is not fasting) She here for complete physical exam.  His medications were reviewed.  He established care back in June.  He has history of hyperlipidemia he had a myocardial infarction requiring a stent placement.  He is taking his carvedilol lisinopril, Lipitor and Brilinta as prescribed.  Lipids at goal   Has cardiology tomorrow   He was referred to vascular surgery secondary to his varicose veins he wears compression hose currently but he is going to proceed with laser ablation of the left side.  Tobacco-  Cessations due for tetanus booster, flu shot  Colonoscopy due PSA discussed/ HIV/Hep C discussed  Family history updated  Review Of Systems:  GEN- denies fatigue, fever, weight loss,weakness, recent illness HEENT- denies eye drainage, change in vision, nasal discharge, CVS- denies chest pain, palpitations RESP- denies SOB, cough, wheeze ABD- denies N/V, change in stools, abd pain GU- denies dysuria, hematuria, dribbling, incontinence MSK- denies joint pain, muscle aches, injury Neuro- denies headache, dizziness, syncope, seizure activity       Objective:    BP 132/82   Pulse 80   Temp 98.9 F (37.2 C) (Oral)   Resp 16   Ht 5' 7.5" (1.715 m)   Wt 161 lb (73 kg)   SpO2 100%   BMI 24.84 kg/m  GEN- NAD, alert and oriented x3 HEENT- PERRL, EOMI, non injected sclera, pink conjunctiva, MMM, oropharynx clear Neck- Supple, no thyromegaly CVS- RRR, no murmur RESP-CTAB ABD-NABS,soft,NT,ND GU- Rectum, normal tone, prostate smooth no nodules, FOBT negative  EXT- large varicose veins L>R non tender, some tortuous Pulses- Radial, DP- 2+  PHQ-9score 0       Assessment & Plan:      Problem List Items Addressed This Visit      Unprioritized   Tobacco abuse   Erectile dysfunction   CAD (coronary artery disease), native coronary  artery    F/u cardiology tomorrow Recent lipid at goal  He is not on long acting nitarte, so given Viagra he can take 1/2 tablet      Relevant Medications   sildenafil (VIAGRA) 100 MG tablet    Other Visit Diagnoses    Routine general medical examination at a health care facility    -  Primary   CPE done, positive hemmoccult but he can not afford colonoscopy at thist ime, will call insurance to check on this. He denies any gross blood in stool, on blood thinners Tetanus booster given    Relevant Orders   CBC with Differential/Platelet   Comprehensive metabolic panel   HIV antibody   Hepatitis C antibody   Prostate cancer screening       PSA to be done, HIV/HEP C testing   Relevant Orders   PSA   Colon cancer screening       Need for hepatitis C screening test       Relevant Orders   Hepatitis C antibody      Note: This dictation was prepared with Dragon dictation along with smaller phrase technology. Any transcriptional errors that result from this process are unintentional.

## 2017-08-23 ENCOUNTER — Ambulatory Visit (INDEPENDENT_AMBULATORY_CARE_PROVIDER_SITE_OTHER): Payer: BLUE CROSS/BLUE SHIELD | Admitting: Interventional Cardiology

## 2017-08-23 ENCOUNTER — Encounter: Payer: Self-pay | Admitting: Interventional Cardiology

## 2017-08-23 VITALS — BP 130/64 | HR 72 | Ht 66.0 in | Wt 159.2 lb

## 2017-08-23 DIAGNOSIS — Z720 Tobacco use: Secondary | ICD-10-CM | POA: Diagnosis not present

## 2017-08-23 DIAGNOSIS — I25119 Atherosclerotic heart disease of native coronary artery with unspecified angina pectoris: Secondary | ICD-10-CM

## 2017-08-23 DIAGNOSIS — E782 Mixed hyperlipidemia: Secondary | ICD-10-CM

## 2017-08-23 LAB — CBC WITH DIFFERENTIAL/PLATELET
BASOS ABS: 59 {cells}/uL (ref 0–200)
BASOS PCT: 0.9 %
Eosinophils Absolute: 364 cells/uL (ref 15–500)
Eosinophils Relative: 5.6 %
HCT: 41.9 % (ref 38.5–50.0)
HEMOGLOBIN: 14.5 g/dL (ref 13.2–17.1)
LYMPHS ABS: 1788 {cells}/uL (ref 850–3900)
MCH: 30.9 pg (ref 27.0–33.0)
MCHC: 34.6 g/dL (ref 32.0–36.0)
MCV: 89.1 fL (ref 80.0–100.0)
MPV: 10.2 fL (ref 7.5–12.5)
Monocytes Relative: 12.3 %
Neutro Abs: 3491 cells/uL (ref 1500–7800)
Neutrophils Relative %: 53.7 %
Platelets: 193 10*3/uL (ref 140–400)
RBC: 4.7 10*6/uL (ref 4.20–5.80)
RDW: 13 % (ref 11.0–15.0)
Total Lymphocyte: 27.5 %
WBC mixed population: 800 cells/uL (ref 200–950)
WBC: 6.5 10*3/uL (ref 3.8–10.8)

## 2017-08-23 LAB — COMPREHENSIVE METABOLIC PANEL
AG Ratio: 1.5 (calc) (ref 1.0–2.5)
ALBUMIN MSPROF: 4.3 g/dL (ref 3.6–5.1)
ALKALINE PHOSPHATASE (APISO): 96 U/L (ref 40–115)
ALT: 24 U/L (ref 9–46)
AST: 31 U/L (ref 10–35)
BILIRUBIN TOTAL: 0.4 mg/dL (ref 0.2–1.2)
BUN: 23 mg/dL (ref 7–25)
CALCIUM: 9.4 mg/dL (ref 8.6–10.3)
CO2: 20 mmol/L (ref 20–32)
Chloride: 104 mmol/L (ref 98–110)
Creat: 0.83 mg/dL (ref 0.70–1.33)
Globulin: 2.9 g/dL (calc) (ref 1.9–3.7)
Glucose, Bld: 104 mg/dL — ABNORMAL HIGH (ref 65–99)
POTASSIUM: 4.1 mmol/L (ref 3.5–5.3)
Sodium: 138 mmol/L (ref 135–146)
Total Protein: 7.2 g/dL (ref 6.1–8.1)

## 2017-08-23 LAB — PSA: PSA: 0.6 ng/mL (ref <=4.0)

## 2017-08-23 LAB — HEPATITIS C ANTIBODY
HEP C AB: NONREACTIVE
SIGNAL TO CUT-OFF: 0.01 (ref <1.00)

## 2017-08-23 LAB — HIV ANTIBODY (ROUTINE TESTING W REFLEX): HIV: NONREACTIVE

## 2017-08-23 NOTE — Patient Instructions (Signed)
Medication Instructions:  Your physician recommends that you continue on your current medications as directed. Please refer to the Current Medication list given to you today.  Labwork: Your physician recommends that you return for lab work in: 6 months for a fasting lipid and liver panel.   Testing/Procedures: None ordered   Follow-Up: Your physician wants you to follow-up in: 6 months with Dr. Katrinka BlazingSmith. You will receive a reminder letter in the mail two months in advance. If you don't receive a letter, please call our office to schedule the follow-up appointment.  Any Other Special Instructions Will Be Listed Below (If Applicable).     If you need a refill on your cardiac medications before your next appointment, please call your pharmacy.

## 2017-08-25 ENCOUNTER — Encounter: Payer: Self-pay | Admitting: *Deleted

## 2017-08-28 ENCOUNTER — Other Ambulatory Visit: Payer: Self-pay | Admitting: Cardiology

## 2017-09-26 ENCOUNTER — Other Ambulatory Visit: Payer: Self-pay

## 2017-09-26 MED ORDER — LISINOPRIL 2.5 MG PO TABS: 2.5000 mg | ORAL_TABLET | Freq: Every day | ORAL | 2 refills | Status: DC

## 2017-09-26 MED ORDER — ATORVASTATIN CALCIUM 80 MG PO TABS: 80.0000 mg | ORAL_TABLET | Freq: Every day | ORAL | 2 refills | Status: DC

## 2017-10-11 ENCOUNTER — Ambulatory Visit (INDEPENDENT_AMBULATORY_CARE_PROVIDER_SITE_OTHER): Payer: BLUE CROSS/BLUE SHIELD | Admitting: Family Medicine

## 2017-10-11 ENCOUNTER — Encounter: Payer: Self-pay | Admitting: Family Medicine

## 2017-10-11 ENCOUNTER — Other Ambulatory Visit: Payer: Self-pay

## 2017-10-11 VITALS — BP 130/72 | HR 76 | Temp 98.5°F | Resp 16 | Ht 66.0 in | Wt 162.0 lb

## 2017-10-11 DIAGNOSIS — L500 Allergic urticaria: Secondary | ICD-10-CM | POA: Diagnosis not present

## 2017-10-11 DIAGNOSIS — IMO0001 Reserved for inherently not codable concepts without codable children: Secondary | ICD-10-CM

## 2017-10-11 MED ORDER — PREDNISONE 10 MG PO TABS: ORAL_TABLET | ORAL | 0 refills | Status: DC

## 2017-10-11 MED ORDER — METHYLPREDNISOLONE ACETATE 80 MG/ML IJ SUSP
80.0000 mg | Freq: Once | INTRAMUSCULAR | Status: AC
  Administered 2017-10-11: 80 mg via INTRAMUSCULAR

## 2017-10-11 NOTE — Patient Instructions (Addendum)
Benadryl for bedtime  25-50mg  at bedtime  Take steroid taper starting in the morning  F/U as needed

## 2017-10-11 NOTE — Progress Notes (Signed)
   Subjective:    Patient ID: Alexander Larson, male    DOB: 07/20/1961, 56 y.o.   MRN: [id number]  Patient presents for Rash (x1 week- hives to arms, leg, back- states that he changed soap last week, but changed back after hives appeared- has been taking allergy pill)   1 week ago, broke out in rash all over. Used anti-itch cream and rubbing alcohol but neither helped. Took OTC antishitamine. No change in diet, used different body wash 2 weeks ago.  No known tick bite    Rash started on left hip region then went all over. No drainage, no blisters, no pain, has not felt ill    Review Of Systems:  GEN- denies fatigue, fever, weight loss,weakness, recent illness HEENT- denies eye drainage, change in vision, nasal discharge, CVS- denies chest pain, palpitations RESP- denies SOB, cough, wheeze ABD- denies N/V, change in stools, abd pain GU- denies dysuria, hematuria, dribbling, incontinence MSK- denies joint pain, muscle aches, injury Neuro- denies headache, dizziness, syncope, seizure activity       Objective:    BP 130/72   Pulse 76   Temp 98.5 F (36.9 C) (Oral)   Resp 16   Ht 5\' 6"  (1.676 m)   Wt 162 lb (73.5 kg)   SpO2 100%   BMI 26.15 kg/m  GEN- NAD, alert and oriented x3 HEENT- PERRL, EOMI, non injected sclera, pink conjunctiva, MMM, oropharynx clear, uuvula midline CVS- RRR, no murmur RESP-CTAB Skin- erythematous patches, with excoriations, some urticarial lesions back, abdomen, arms, bilat legs, larger patchy with scaliness on Left upper thigh/Hip, no central clearing        Assessment & Plan:      Problem List Items Addressed This Visit    None    Visit Diagnoses    Allergic reaction, urticaria    -  Primary   Most likley due to new soap/ he used on entire body. He has discontinued, will give Depo Medrol 80mg , then start prednisone taper, benadryl at bedtime      Note: This dictation was prepared with Dragon dictation along with smaller phrase technology.  Any transcriptional errors that result from this process are unintentional.

## 2017-10-11 NOTE — Addendum Note (Signed)
Addended by: Phillips OdorSIX, CHRISTINA H on: 10/11/2017 10:45 AM   Modules accepted: Orders

## 2017-10-31 ENCOUNTER — Telehealth: Payer: Self-pay | Admitting: *Deleted

## 2017-10-31 NOTE — Telephone Encounter (Signed)
Received call from patient.   Reports that he continues to have itchy areas and requested refill on itch medication.   Call placed to patient to inquire.LMTRC.

## 2017-11-01 ENCOUNTER — Other Ambulatory Visit: Payer: Self-pay | Admitting: Family Medicine

## 2017-11-01 MED ORDER — PREDNISONE 10 MG PO TABS: ORAL_TABLET | ORAL | 0 refills | Status: DC

## 2017-11-01 NOTE — Telephone Encounter (Signed)
Patient returned call.   States that rash is improved since last dosage with prednisone. States that he continues to have small irritated area. MD made aware and approved refill with decreased dose. Advised that if area is not resolved after dose, OV will be needed.   Call placed to patient and patient made aware.   Prescription sent to pharmacy.

## 2017-11-17 ENCOUNTER — Telehealth: Payer: Self-pay | Admitting: *Deleted

## 2017-11-17 ENCOUNTER — Telehealth: Payer: Self-pay | Admitting: Interventional Cardiology

## 2017-11-17 NOTE — Telephone Encounter (Signed)
If on Carvedilol 3.125 mg BID, it is okay to DC.

## 2017-11-17 NOTE — Telephone Encounter (Signed)
Received call from patient.   Reports that he thinks the Coreg is causing his urticaria. States that he has stopped it x2 days and no itching or irritation has been noted.   Advised that he should contact cards for medication alternative.   MD to be made aware.

## 2017-11-17 NOTE — Telephone Encounter (Signed)
New message   Pt c/o medication issue:  1. Name of Medication: carvedilol (COREG) 3.125 MG tablet  2. How are you currently taking this medication (dosage and times per day)? TAKE 1 TABLET (3.125 MG TOTAL) BY MOUTH 2 (TWO) TIMES DAILY WITH A MEAL  3. Are you having a reaction (difficulty breathing--STAT)? Yes   4. What is your medication issue? Pt says that he is itching with rash and wants to have an alternative medication

## 2017-11-17 NOTE — Telephone Encounter (Signed)
Pt states about 2-3 weeks ago he developed itching and a rash.  He went to PCP, they gave him a steroid shot and oral steroids to go home with.  Things cleared up and then returned so he had another round of steroids.  Sx returned again so pt stopped taking meds one at a time to see if they may be the issue.  Pt states he stopped his Coreg 2 days ago and sx have greatly improved but not resolved yet.  Pt would like to switch medications.  Advised I will send to Dr. Katrinka BlazingSmith for review and advisement.

## 2017-11-18 NOTE — Telephone Encounter (Signed)
Noted pt contacted cardiology

## 2017-11-21 NOTE — Telephone Encounter (Signed)
Spoke with pt and made him aware of recommendations per Dr. Katrinka BlazingSmith.  Advised to call us if rash does not go away or if HR or BP gets high.  Pt verbalized understanding and was appreciative for call.

## 2017-11-21 NOTE — Telephone Encounter (Signed)
Left message to call back  

## 2017-11-22 ENCOUNTER — Telehealth: Payer: Self-pay | Admitting: Family Medicine

## 2017-11-22 DIAGNOSIS — L509 Urticaria, unspecified: Secondary | ICD-10-CM

## 2017-11-22 NOTE — Telephone Encounter (Signed)
Received call from patient.   Reports that he continues to have itching and irritation.   Requested referral to dermatology.   Ok to place order?

## 2017-11-22 NOTE — Telephone Encounter (Signed)
Okay to place referral for urticaria

## 2017-11-22 NOTE — Telephone Encounter (Signed)
Call placed to patient and patient made aware.   Referral orders placed.  

## 2017-11-22 NOTE — Telephone Encounter (Signed)
Patient would like referral to a dermatologist, regarding the appointment he had with you, he is still having problems    [phone]

## 2018-01-07 ENCOUNTER — Encounter (HOSPITAL_COMMUNITY): Payer: Self-pay

## 2018-01-07 ENCOUNTER — Ambulatory Visit (INDEPENDENT_AMBULATORY_CARE_PROVIDER_SITE_OTHER): Payer: Commercial Managed Care - PPO

## 2018-01-07 ENCOUNTER — Ambulatory Visit (HOSPITAL_COMMUNITY)
Admission: EM | Admit: 2018-01-07 | Discharge: 2018-01-07 | Disposition: A | Discharge status: Home / Self Care | Payer: Commercial Managed Care - PPO | Attending: Family Medicine | Admitting: Family Medicine

## 2018-01-07 ENCOUNTER — Other Ambulatory Visit: Payer: Self-pay

## 2018-01-07 DIAGNOSIS — S67197A Crushing injury of left little finger, initial encounter: Secondary | ICD-10-CM | POA: Diagnosis not present

## 2018-01-07 DIAGNOSIS — S67195A Crushing injury of left ring finger, initial encounter: Secondary | ICD-10-CM

## 2018-01-07 DIAGNOSIS — S6710XA Crushing injury of unspecified finger(s), initial encounter: Secondary | ICD-10-CM

## 2018-01-07 DIAGNOSIS — S62669A Nondisplaced fracture of distal phalanx of unspecified finger, initial encounter for closed fracture: Secondary | ICD-10-CM

## 2018-01-07 MED ORDER — HYDROCODONE-ACETAMINOPHEN 5-325 MG PO TABS: 1.0000 | ORAL_TABLET | Freq: Four times a day (QID) | ORAL | 0 refills | Status: DC | PRN

## 2018-01-07 MED ORDER — NAPROXEN 500 MG PO TABS: 500.0000 mg | ORAL_TABLET | Freq: Two times a day (BID) | ORAL | 0 refills | Status: DC

## 2018-01-07 NOTE — Discharge Instructions (Signed)
You will need to follow up with a hand specialist. See information given. Please do your best to keep the injured fingers clean and dry.  Be aware, pain medications may cause drowsiness. Please do not drive, operate heavy machinery or make important decisions while on this medication, it can cloud your judgement.

## 2018-01-07 NOTE — ED Provider Notes (Signed)
[facility]   [id number] 01/07/18 Arrival Time: 1335  ASSESSMENT & PLAN:  1. Crush injury to finger, initial encounter    Imaging: Dg Hand Complete Left  Result Date: 01/07/2018 CLINICAL DATA:  Pt had 4th and 5th digit of left hand got caught in a machine air press last night. Pain at DiP joint in both affected digits extending to medial phalange and distal phalange. Constant throbbing pain, sharp pain when trying to be.*comment was truncated* EXAM: LEFT HAND - COMPLETE 3+ VIEW COMPARISON:  None. FINDINGS: Fracture of the distal phalanx tuft of the fifth digit with minimal displacement. Fracture at the metaphyseal base of the distal phalanx fourth digit. The fracture may enter the DIP articular surface. Rounded density along the ventral surface of the fourth distal phalanx suggests hematoma - measuring 7 mm. IMPRESSION: Fractures of the distal fourth and fifth phalanges. Swelling of the fourth digit. Electronically Signed   By: Genevive Bi M.D.   On: 01/07/2018 15:51   Meds ordered this encounter  Medications  . naproxen (NAPROSYN) 500 MG tablet    Sig: Take 1 tablet (500 mg total) by mouth 2 (two) times daily.    Dispense:  14 tablet    Refill:  0  . HYDROcodone-acetaminophen (NORCO/VICODIN) 5-325 MG tablet    Sig: Take 1 tablet by mouth every 6 (six) hours as needed for moderate pain or severe pain.    Dispense:  12 tablet    Refill:  0   [place] Controlled Substances Registry consulted for this patient. I feel the risk/benefit ratio today is favorable for proceeding with this prescription for a controlled substance. Medication sedation precautions given.  Splinted. To f/u with hand specialist. Will self-schedule. May f/u here as needed.  Reviewed expectations re: course of current medical issues. Questions answered. Outlined signs and symptoms indicating need for more acute intervention. Patient verbalized understanding. After Visit Summary given.  SUBJECTIVE: History  from: patient. Alexander Larson is a 57 y.o. male who reports a crush injury to his left 4th and 5th distal fingers. Reports getting fingers caught in press. Immediate pain with some bleeding. Now with more swelling. Bleeding controlled. Pain now constant. No OTC analgesics taken. Mild "tingling" in fingertips. Worsened by movement. Washed at home.  Immunization History  Administered Date(s) Administered  . Tdap 08/22/2017   ROS: As per HPI.   OBJECTIVE:  Vitals:   01/07/18 1448  BP: (!) 161/101  Pulse: 86  Resp: 18  Temp: 99 F (37.2 C)  TempSrc: Oral  SpO2: 100%    General appearance: alert; no distress Extremities: no cyanosis or edema; symmetrical with no gross deformities; tenderness over his left distal 4th and 5th fingers with moderate swelling and moderate bruising; ROM: normal but painful; a few small cuts around fingertips without active bleeding CV: normal extremity capillary refill Skin: warm and dry Neurologic: normal gait; normal symmetric reflexes in all extremities; normal sensation in all extremities Psychological: alert and cooperative; normal mood and affect  Allergies  Allergen Reactions  . Carvedilol Rash    Past Medical History:  Diagnosis Date  . Acute ST elevation myocardial infarction (STEMI) of inferior wall (HCC) 04/05/2017  . Alcohol abuse 04/05/2017  . CAD (coronary artery disease)    04/05/17 PCI DES--RCA, EF 55%  . CAD (coronary artery disease), native coronary artery 04/07/2017  . Constipation 04/15/2017  . Hyperlipidemia   . Mixed hyperlipidemia   . Status post coronary artery stent placement   . STEMI (ST elevation myocardial  infarction) (HCC) 04/05/2017  . Tobacco abuse 04/05/2017  . Tobacco use   . Varicose veins of both lower extremities with pain    Social History   Socioeconomic History  . Marital status: Married    Spouse name: Not on file  . Number of children: Not on file  . Years of education: Not on file  . Highest  education level: Not on file  Social Needs  . Financial resource strain: Not on file  . Food insecurity - worry: Not on file  . Food insecurity - inability: Not on file  . Transportation needs - medical: Not on file  . Transportation needs - non-medical: Not on file  Occupational History  . Not on file  Tobacco Use  . Smoking status: Current Some Day Smoker    Types: Cigarettes  . Smokeless tobacco: Never Used  Substance and Sexual Activity  . Alcohol use: Yes    Alcohol/week: 7.2 oz    Types: 12 Cans of beer per week    Comment: 1 a day  . Drug use: No  . Sexual activity: Not Currently  Other Topics Concern  . Not on file  Social History Narrative  . Not on file   Family History  Problem Relation Age of Onset  . Hypertension Father   . Cancer Father        lung   Past Surgical History:  Procedure Laterality Date  . CORONARY STENT INTERVENTION N/A 04/05/2017   Procedure: Coronary Stent Intervention;  Surgeon: Lyn RecordsSmith, Henry W, MD;  [facility];  Service: Cardiovascular;  Laterality: N/A;  . LEFT HEART CATH AND CORONARY ANGIOGRAPHY N/A 04/05/2017   Procedure: Left Heart Cath and Coronary Angiography;  Surgeon: Lyn RecordsSmith, Henry W, MD;  [facility];  Service: Cardiovascular;  Laterality: N/A;      Mardella LaymanHagler, Willia Genrich, MD 01/12/18 [phone]

## 2018-01-07 NOTE — ED Notes (Signed)
Ring removed left hand 4th digit via ring cutter.  Cut ring in pt's possession.

## 2018-01-07 NOTE — ED Triage Notes (Signed)
Pt presents today with left hand pain from smashing it with aero press yesterday evening. States it is very painful and swollen.

## 2018-02-21 ENCOUNTER — Other Ambulatory Visit: Payer: BLUE CROSS/BLUE SHIELD

## 2018-04-02 ENCOUNTER — Other Ambulatory Visit: Payer: Self-pay | Admitting: Interventional Cardiology

## 2018-04-23 ENCOUNTER — Other Ambulatory Visit: Payer: Self-pay | Admitting: Interventional Cardiology

## 2018-04-25 NOTE — Telephone Encounter (Signed)
Pt's pharmacy is requesting a refill on omega-3. Would Dr. Smith like to refill this medication? Please address °

## 2018-04-25 NOTE — Telephone Encounter (Signed)
Yes

## 2018-05-14 NOTE — Progress Notes (Signed)
Cardiology Office Note:    Date:  05/17/2018   ID:  Alexander Larson, DOB 19-Apr-1961, MRN [id number]  PCP:  Salley Scarlet, MD  Cardiologist:  No primary care provider on file.   Referring MD: Salley Scarlet, MD   Chief Complaint  Patient presents with  . Coronary Artery Disease    History of Present Illness:    Alexander Larson is a 57 y.o. male with a hx of acute inferior myocardial infarction on 04/05/2017 and had acute intervention on the right coronary with total occlusion time less than 1.5 hours. Myocardial impairment was minimal and enzyme release was low.  The patient has no complaints.  Several months after being on carvedilol he developed allergy.  He stopped the medication after notifying us that he thought there was a problem.  Skin rash and pruritus completely resolved.  He was on very low-dose therapy.  Mentation is at work.  He denies chest pain.  No orthopnea, PND, or palpitations.   Past Medical History:  Diagnosis Date  . Acute ST elevation myocardial infarction (STEMI) of inferior wall (HCC) 04/05/2017  . Alcohol abuse 04/05/2017  . CAD (coronary artery disease)    04/05/17 PCI DES--RCA, EF 55%  . CAD (coronary artery disease), native coronary artery 04/07/2017  . Constipation 04/15/2017  . Hyperlipidemia   . Mixed hyperlipidemia   . Status post coronary artery stent placement   . STEMI (ST elevation myocardial infarction) (HCC) 04/05/2017  . Tobacco abuse 04/05/2017  . Tobacco use   . Varicose veins of both lower extremities with pain     Past Surgical History:  Procedure Laterality Date  . CORONARY STENT INTERVENTION N/A 04/05/2017   Procedure: Coronary Stent Intervention;  Surgeon: Lyn Records, MD;  Location[facility] INVAS[facility] CV LAB;  Service: Cardiovascular;  Laterality: N/A;  . LEFT HEART CATH AND CORONARY ANGIOGRAPHY N/A 04/05/2017   Procedure: Left Heart Cath and Coronary Angiography;  Surgeon: Lyn Records, MD;  Location: [facility] IN[facility]AS[facility] CV LAB;  Service:  Cardiovascular;  Laterality: N/A;    Current Medications: Current Meds  Medication Sig  . aspirin 81 MG chewable tablet Chew 1 tablet (81 mg total) by mouth daily.  Marland Kitchen atorvastatin (LIPITOR) 80 MG tablet Take 1 tablet (80 mg total) by mouth daily at 6 PM.  . HYDROcodone-acetaminophen (NORCO/VICODIN) 5-325 MG tablet Take 1 tablet by mouth every 6 (six) hours as needed for moderate pain or severe pain.  Marland Kitchen lisinopril (PRINIVIL,ZESTRIL) 2.5 MG tablet Take 1 tablet (2.5 mg total) by mouth daily.  . naproxen (NAPROSYN) 500 MG tablet Take 1 tablet (500 mg total) by mouth 2 (two) times daily.  . nitroGLYCERIN (NITROSTAT) 0.4 MG SL tablet Place 1 tablet (0.4 mg total) under the tongue every 5 (five) minutes as needed.  Marland Kitchen omega-3 acid ethyl esters (LOVAZA) 1 g capsule Take 2 capsules (2 g total) by mouth daily. Please keep upcoming appt for future refills. Thank you  . [DISCONTINUED] clopidogrel (PLAVIX) 75 MG tablet Take 1 tablet (75 mg total) by mouth daily. Please keep upcoming appt with Dr. Katrinka Blazing in July for future refills. Thank you     Allergies:   Carvedilol   Social History   Socioeconomic History  . Marital status: Married    Spouse name: Not on file  . Number of children: Not on file  . Years of education: Not on file  . Highest education level: Not on file  Occupational History  . Not on file  Social Needs  .  Financial resource strain: Not on file  . Food insecurity:    Worry: Not on file    Inability: Not on file  . Transportation needs:    Medical: Not on file    Non-medical: Not on file  Tobacco Use  . Smoking status: Current Some Day Smoker    Types: Cigarettes  . Smokeless tobacco: Never Used  Substance and Sexual Activity  . Alcohol use: Yes    Alcohol/week: 7.2 oz    Types: 12 Cans of beer per week    Comment: 1 a day  . Drug use: No  . Sexual activity: Not Currently  Lifestyle  . Physical activity:    Days per week: Not on file    Minutes per session: Not on  file  . Stress: Not on file  Relationships  . Social connections:    Talks on phone: Not on file    Gets together: Not on file    Attends religious service: Not on file    Active member of club or organization: Not on file    Attends meetings of clubs or organizations: Not on file    Relationship status: Not on file  Other Topics Concern  . Not on file  Social History Narrative  . Not on file     Family History: The patient's family history includes Cancer in his father; Hypertension in his father.  ROS:   Please see the history of present illness.    Leg pain.  Allergy to carvedilol as noted above.  All other systems reviewed and are negative.  EKGs/Labs/Other Studies Reviewed:    The following studies were reviewed today: No new data  EKG:  EKG is  ordered today.  The ekg ordered today demonstrates this rhythm with nonspecific T wave flattening otherwise normal.  No evidence of infarction.  Vertical axis noted.  Recent Labs: 08/22/2017: ALT 24; BUN 23; Creat 0.83; Hemoglobin 14.5; Platelets 193; Potassium 4.1; Sodium 138  Recent Lipid Panel    Component Value Date/Time   CHOL 122 04/22/2017 0754   TRIG 107 04/22/2017 0754   HDL 28 (L) 04/22/2017 0754   CHOLHDL 4.4 04/22/2017 0754   CHOLHDL 6.2 04/05/2017 0803   VLDL 60 (H) 04/05/2017 0803   LDLCALC 73 04/22/2017 0754    Physical Exam:    VS:  BP 132/88   Pulse 76   Ht 5\' 6"  (1.676 m)   Wt 160 lb 3.2 oz (72.7 kg)   BMI 25.86 kg/m     Wt Readings from Last 3 Encounters:  05/17/18 160 lb 3.2 oz (72.7 kg)  10/11/17 162 lb (73.5 kg)  08/23/17 159 lb 3.2 oz (72.2 kg)     GEN:  Well nourished, well developed in no acute distress HEENT: Normal NECK: No JVD. LYMPHATICS: No lymphadenopathy CARDIAC: RRR, nomurmur, nogallop, no edema. VASCULAR: Bounding carotid, radial, and posterior tibial pulses.  No bruits. RESPIRATORY:  Clear to auscultation without rales, wheezing or rhonchi  ABDOMEN: Soft, non-tender,  non-distended, No pulsatile mass, MUSCULOSKELETAL: No deformity  SKIN: Warm and dry NEUROLOGIC:  Alert and oriented x 3 PSYCHIATRIC:  Normal affect   ASSESSMENT:    1. Coronary artery disease involving native coronary artery of native heart with angina pectoris (HCC)   2. Mixed hyperlipidemia   3. Tobacco abuse   4. Status post coronary artery stent placement    PLAN:    In order of problems listed above:  1. The patient is having no ischemic symptoms.  He is physically very active without limitations.  No functional testing needed.  Discontinue Plavix.  Other medications will be continued as listed. 2. Liver and lipid panel today.  LDL target less than 70. 3. Re-discussed smoking cessation.  He says he is using only several cigarettes per day.  I asked him to quit.  He is not willing to commit at this time.  Aerobic activity, plant-based diet, smoking cessation, and medication compliance discussed in detail.  Clinical follow-up in 1 year.   Medication Adjustments/Labs and Tests Ordered: Current medicines are reviewed at length with the patient today.  Concerns regarding medicines are outlined above.  Orders Placed This Encounter  Procedures  . Hepatic function panel  . Lipid Profile  . EKG 12-Lead   No orders of the defined types were placed in this encounter.   Patient Instructions  Medication Instructions:  1) DISCONTINUE Plavix  Labwork: Liver and Lipid today  Testing/Procedures: None  Follow-Up: Your physician wants you to follow-up in: 1 year with Dr. Katrinka Blazing.  You will receive a reminder letter in the mail two months in advance. If you don't receive a letter, please call our office to schedule the follow-up appointment.   Any Other Special Instructions Will Be Listed Below (If Applicable).     If you need a refill on your cardiac medications before your next appointment, please call your pharmacy.      Signed, Lesleigh Noe, MD  05/17/2018 4:43 PM     [facility]

## 2018-05-17 ENCOUNTER — Encounter: Payer: Self-pay | Admitting: Interventional Cardiology

## 2018-05-17 ENCOUNTER — Encounter (INDEPENDENT_AMBULATORY_CARE_PROVIDER_SITE_OTHER): Payer: Self-pay

## 2018-05-17 ENCOUNTER — Ambulatory Visit (INDEPENDENT_AMBULATORY_CARE_PROVIDER_SITE_OTHER): Payer: Commercial Managed Care - PPO | Admitting: Interventional Cardiology

## 2018-05-17 VITALS — BP 132/88 | HR 76 | Ht 66.0 in | Wt 160.2 lb

## 2018-05-17 DIAGNOSIS — E782 Mixed hyperlipidemia: Secondary | ICD-10-CM | POA: Diagnosis not present

## 2018-05-17 DIAGNOSIS — Z720 Tobacco use: Secondary | ICD-10-CM | POA: Diagnosis not present

## 2018-05-17 DIAGNOSIS — Z955 Presence of coronary angioplasty implant and graft: Secondary | ICD-10-CM

## 2018-05-17 DIAGNOSIS — I25119 Atherosclerotic heart disease of native coronary artery with unspecified angina pectoris: Secondary | ICD-10-CM | POA: Diagnosis not present

## 2018-05-17 NOTE — Patient Instructions (Signed)
Medication Instructions:  1) DISCONTINUE Plavix  Labwork: Liver and Lipid today  Testing/Procedures: None  Follow-Up: Your physician wants you to follow-up in: 1 year with Dr Smith.  You will receive a reminder letter in the mail two months in advance. If you don't receive a letter, please call our office to schedule the follow-up appointment.   Any Other Special Instructions Will Be Listed Below (If Applicable).     If you need a refill on your cardiac medications before your next appointment, please call your pharmacy.   

## 2018-05-18 ENCOUNTER — Telehealth: Payer: Self-pay | Admitting: Interventional Cardiology

## 2018-05-18 LAB — LIPID PANEL
CHOL/HDL RATIO: 4 ratio (ref 0.0–5.0)
CHOLESTEROL TOTAL: 181 mg/dL (ref 100–199)
HDL: 45 mg/dL (ref >39)
LDL Calculated: 87 mg/dL (ref 0–99)
Triglycerides: 244 mg/dL — ABNORMAL HIGH (ref 0–149)
VLDL CHOLESTEROL CAL: 49 mg/dL — AB (ref 5–40)

## 2018-05-18 LAB — HEPATIC FUNCTION PANEL
ALT: 33 IU/L (ref 0–44)
AST: 34 IU/L (ref 0–40)
Albumin: 4.6 g/dL (ref 3.5–5.5)
Alkaline Phosphatase: 98 IU/L (ref 39–117)
BILIRUBIN TOTAL: 0.6 mg/dL (ref 0.0–1.2)
Bilirubin, Direct: 0.2 mg/dL (ref 0.00–0.40)
TOTAL PROTEIN: 7.1 g/dL (ref 6.0–8.5)

## 2018-05-18 NOTE — Telephone Encounter (Signed)
Follow Up: ° ° ° °Returning your call from this morning. °

## 2018-05-18 NOTE — Telephone Encounter (Signed)
Informed pt of results. Pt verbalized understanding. 

## 2018-06-21 ENCOUNTER — Other Ambulatory Visit: Payer: Self-pay | Admitting: Interventional Cardiology

## 2018-07-24 ENCOUNTER — Other Ambulatory Visit: Payer: Self-pay | Admitting: Interventional Cardiology

## 2018-10-27 ENCOUNTER — Telehealth: Payer: Self-pay | Admitting: *Deleted

## 2018-10-27 DIAGNOSIS — E782 Mixed hyperlipidemia: Secondary | ICD-10-CM

## 2018-10-27 NOTE — Telephone Encounter (Signed)
Called pt to get him scheduled for fasting labs at the end of January.  Left message for pt to call back to schedule these labs.  Orders have been put in, pt just needs to be scheduled.  :abs are liver and lipid.  Please remind pt nothing to eat or drink after midnight except water and black coffee.

## 2018-10-27 NOTE — Telephone Encounter (Signed)
-----   Message from Julio Sicks, LPN sent at 1/57/2620  4:05 PM EDT ----- Lipid and liver in late Jan

## 2018-11-08 ENCOUNTER — Telehealth: Payer: Self-pay | Admitting: Interventional Cardiology

## 2018-11-08 NOTE — Telephone Encounter (Signed)
Spoke to patient who is calling because he is having "mild aching in his chest periodically during the day, no pain at night."  He has no means of checking BP.  He will go purchase a BP machine and check his BP and record over the next few days.  He has NTG, but the pain is not significant enough to take any.  He will go to the ED if the pain worsens, but will call on 1/17 to give us an update and may schedule an appt.

## 2018-11-08 NOTE — Telephone Encounter (Signed)
New Message   Pt c/o of Chest Pain: STAT if CP now or developed within 24 hours  1. Are you having CP right now? no  2. Are you experiencing any other symptoms (ex. SOB, nausea, vomiting, sweating)? No  3. How long have you been experiencing CP? Pain last a few minutes and comes and goes   4. Is your CP continuous or coming and going? Coming and going   5. Have you taken Nitroglycerin? no ?

## 2018-11-09 ENCOUNTER — Encounter: Payer: Self-pay | Admitting: Physician Assistant

## 2018-11-09 ENCOUNTER — Telehealth: Payer: Self-pay

## 2018-11-09 NOTE — Telephone Encounter (Signed)
Spoke to the patient and informed him that I found a schedule opening for 11/10/2018 with Scott at 8:45 am.  He said that would be great because he continues to have the occasional "dull ache" in the chest.

## 2018-11-09 NOTE — Telephone Encounter (Signed)
Spoke to the patient and reminded him to fast this evening prior to appt in the morning.  He verbalized understanding.

## 2018-11-09 NOTE — Progress Notes (Signed)
Cardiology Office Note:    Date:  11/10/2018   ID:  Alexander ChenEdward Larson, DOB 02/01/1961, MRN [id number]  PCP:  Salley Scarleturham, Alexander F, MD  Cardiologist:  Lesleigh NoeHenry W Smith III, MD   Electrophysiologist:  None   Referring MD: Salley Scarleturham, Alexander F, MD   Chief Complaint  Patient presents with  . Chest Pain     History of Present Illness:    Alexander Larson is a 58 y.o. [place] male with coronary artery disease s/p inferior ST elevation myocardial infarction in 03/2017 treated with a DES to the RCA, hyperlipidemia, tobacco abuse.  He was last seen by Dr. Katrinka BlazingSmith in 04/2018.  He called in recently with complaints of chest pain.     Mr. Alexander Larson returns for the evaluation of chest pain.  He is here alone.  Over the past 2 months he has had bilateral chest discomfort.  It feels like a burning sensation.  It comes and goes.  It is not necessarily brought on by exertion.  He denies any associated symptoms.  Has not had orthopnea, paroxysmal nocturnal dyspnea.  He does note some lower extremity swelling.  He does note some change in his symptoms with positional changes.  It is not clear if he has pleuritic chest pain.  He does not have chest pain with lying supine.  He is somewhat concerned because he is also having right leg pain.  This seems to get worse with exertion at times.  He has had to stop walking because of it.  He had right leg symptoms prior to his heart attack as well.  He has had a cough for about a month.  He has not had any sputum production.  He denies any bleeding issues.  He still smokes about 2 cigarettes a day.  Prior CV studies:   The following studies were reviewed today:  Cardiac Catheterization 04/05/17 LAD patent RCA prox 100 EF 55-65 PCI:  3.5 x 38 mm Resolute DES to the RCA  Past Medical History:  Diagnosis Date  . Alcohol abuse 04/05/2017  . CAD (coronary artery disease)    04/05/17 PCI DES--RCA, EF 55%  . Constipation 04/15/2017  . Hyperlipidemia   . Mixed hyperlipidemia   . s/p  Inferior STEMI 03/2017 tx with DES to RCA 04/05/2017  . Tobacco abuse 04/05/2017  . Varicose veins of both lower extremities with pain    Surgical Hx: The patient  has a past surgical history that includes LEFT HEART CATH AND CORONARY ANGIOGRAPHY (N/A, 04/05/2017) and CORONARY STENT INTERVENTION (N/A, 04/05/2017).   Current Medications: Current Meds  Medication Sig  . aspirin 81 MG chewable tablet Chew 1 tablet (81 mg total) by mouth daily.  Marland Kitchen. atorvastatin (LIPITOR) 80 MG tablet TAKE 1 TABLET (80 MG TOTAL) BY MOUTH DAILY AT 6 PM.  . ergocalciferol (VITAMIN D2) 1.25 MG (50000 UT) capsule Vitamin D2 1,250 mcg (50,000 unit) capsule  Take 1 capsule every week by oral route as directed for 60 days.  Marland Kitchen. HYDROcodone-acetaminophen (NORCO/VICODIN) 5-325 MG tablet Take 1 tablet by mouth every 6 (six) hours as needed for moderate pain or severe pain.  Marland Kitchen. lisinopril (PRINIVIL,ZESTRIL) 2.5 MG tablet TAKE 1 TABLET BY MOUTH EVERY DAY  . nitroGLYCERIN (NITROSTAT) 0.4 MG SL tablet Place 1 tablet (0.4 mg total) under the tongue every 5 (five) minutes as needed.  Marland Kitchen. omega-3 acid ethyl esters (LOVAZA) 1 g capsule Take 2 capsules (2 g total) by mouth daily.  . predniSONE (DELTASONE) 10 MG tablet Take 10 mg  by mouth daily.   . [DISCONTINUED] naproxen (NAPROSYN) 500 MG tablet Take 1 tablet (500 mg total) by mouth 2 (two) times daily.     Allergies:   Carvedilol   Social History   Tobacco Use  . Smoking status: Current Some Day Smoker    Types: Cigarettes  . Smokeless tobacco: Never Used  Substance Use Topics  . Alcohol use: Yes    Alcohol/week: 12.0 standard drinks    Types: 12 Cans of beer per week    Comment: 1 a day  . Drug use: No     Family Hx: The patient's family history includes Cancer in his father; Hypertension in his father.  ROS:   Please see the history of present illness.    Review of Systems  Constitution: Positive for diaphoresis.  Cardiovascular: Positive for leg swelling.    Respiratory: Positive for snoring.   Musculoskeletal: Positive for joint pain.   All other systems reviewed and are negative.   EKGs/Labs/Other Test Reviewed:    EKG:  EKG is  ordered today.  The ekg ordered today demonstrates NSR, HR 83, normal axis, QTc 444, no change from prior ECG  Recent Labs: 05/17/2018: ALT 33   Recent Lipid Panel Lab Results  Component Value Date/Time   CHOL 181 05/17/2018 04:37 PM   TRIG 244 (H) 05/17/2018 04:37 PM   HDL 45 05/17/2018 04:37 PM   CHOLHDL 4.0 05/17/2018 04:37 PM   CHOLHDL 6.2 04/05/2017 08:03 AM   LDLCALC 87 05/17/2018 04:37 PM    Physical Exam:    VS:  BP 132/80   Pulse 83   Ht 5\' 6"  (1.676 m)   Wt 158 lb 1.9 oz (71.7 kg)   SpO2 95%   BMI 25.52 kg/m     Wt Readings from Last 3 Encounters:  11/10/18 158 lb 1.9 oz (71.7 kg)  05/17/18 160 lb 3.2 oz (72.7 kg)  10/11/17 162 lb (73.5 kg)     Physical Exam  Constitutional: He is oriented to person, place, and time. He appears well-developed and well-nourished. No distress.  HENT:  Head: Normocephalic and atraumatic.  Eyes: No scleral icterus.  Neck: Neck supple. No JVD present. No thyromegaly present.  Cardiovascular: Normal rate, regular rhythm, S1 normal and S2 normal.  No murmur heard. Pulses:      Femoral pulses are 1+ on the right side.      Popliteal pulses are 1+ on the right side.       Dorsalis pedis pulses are 2+ on the right side.       Posterior tibial pulses are 2+ on the right side.  Pulmonary/Chest: Breath sounds normal. He has no rales.  Abdominal: Soft. He exhibits no distension. There is no hepatomegaly.  Musculoskeletal:        General: No edema.  Lymphadenopathy:    He has no cervical adenopathy.  Neurological: He is alert and oriented to person, place, and time.  Skin: Skin is warm and dry.  Psychiatric: He has a normal mood and affect.    ASSESSMENT & PLAN:    Other chest pain His chest discomfort is somewhat atypical for ischemia.  He is  somewhat concerned because he did have right leg symptoms prior to his heart attack.  He is currently having right leg pain as well.  His ECG does not demonstrate any ischemic changes.  His cardiac catheterization at the time of his myocardial infarction demonstrated single-vessel disease.  He does continue to smoke.  He has  had a cough the past month.  Some of his symptoms sound consistent with musculoskeletal chest pain.  -Arrange exercise Myoview  -Arrange chest x-ray  -Follow-up 4 to 6 weeks  Claudication of right lower extremity (HCC) He has fairly significant pain in his right leg at times.  This is sometimes made worse with exertion.  He does have good pulses on exam.  I doubt he has peripheral arterial disease.  However, as he is a smoker, I will evaluate him for PAD.  -Arrange ABIs/lower extremity arterial Dopplers  -If ABIs normal, follow up with PCP for further evaluation  Coronary artery disease involving native coronary artery of native heart without angina pectoris History of ST elevation MI in 6/18 treated with a drug-eluting stent to the RCA.  As noted, he has had chest discomfort for the past couple of months.  Arrange Myoview as noted.  Continue aspirin, atorvastatin, lisinopril.  Mixed hyperlipidemia Continue high-dose statin therapy.  Tobacco abuse He continues to smoke.  I have recommended that he stop.  Cough His cough may be contributing to his chest pain.  As he is a smoker, I will arrange a chest x-ray today.  If his nuclear stress test does not demonstrate any significant ischemia, he should follow up with his PCP to evaluate his cough.     Dispo:  Return in about 6 weeks (around 12/22/2018) for Follow up after testing w/ Dr. Katrinka BlazingSmith, or Tereso NewcomerScott Weaver, PA-C.   Medication Adjustments/Labs and Tests Ordered: Current medicines are reviewed at length with the patient today.  Concerns regarding medicines are outlined above.  Tests Ordered: Orders Placed This Encounter    Procedures  . DG Chest 2 View  . MYOCARDIAL PERFUSION IMAGING  . EKG 12-Lead   Medication Changes: No orders of the defined types were placed in this encounter.   Signed, Tereso NewcomerScott Weaver, PA-C  11/10/2018 9:39 AM    [facility] [address], [place], [place]  [address] Phone: [phone](336) [phone]; Fax: [phone]) [phone]

## 2018-11-10 ENCOUNTER — Encounter: Payer: Self-pay | Admitting: Physician Assistant

## 2018-11-10 ENCOUNTER — Ambulatory Visit (INDEPENDENT_AMBULATORY_CARE_PROVIDER_SITE_OTHER): Payer: Commercial Managed Care - PPO | Admitting: Physician Assistant

## 2018-11-10 ENCOUNTER — Other Ambulatory Visit (HOSPITAL_COMMUNITY): Payer: Self-pay | Admitting: Physician Assistant

## 2018-11-10 ENCOUNTER — Ambulatory Visit
Admission: RE | Admit: 2018-11-10 | Discharge: 2018-11-10 | Disposition: A | Discharge status: Home / Self Care | Payer: Commercial Managed Care - PPO | Source: Ambulatory Visit | Attending: Physician Assistant | Admitting: Physician Assistant

## 2018-11-10 ENCOUNTER — Encounter (INDEPENDENT_AMBULATORY_CARE_PROVIDER_SITE_OTHER): Payer: Self-pay

## 2018-11-10 VITALS — BP 132/80 | HR 83 | Ht 66.0 in | Wt 158.1 lb

## 2018-11-10 DIAGNOSIS — I739 Peripheral vascular disease, unspecified: Secondary | ICD-10-CM

## 2018-11-10 DIAGNOSIS — I2510 Atherosclerotic heart disease of native coronary artery without angina pectoris: Secondary | ICD-10-CM

## 2018-11-10 DIAGNOSIS — R05 Cough: Secondary | ICD-10-CM

## 2018-11-10 DIAGNOSIS — R059 Cough, unspecified: Secondary | ICD-10-CM

## 2018-11-10 DIAGNOSIS — E782 Mixed hyperlipidemia: Secondary | ICD-10-CM | POA: Diagnosis not present

## 2018-11-10 DIAGNOSIS — Z720 Tobacco use: Secondary | ICD-10-CM

## 2018-11-10 DIAGNOSIS — R079 Chest pain, unspecified: Secondary | ICD-10-CM

## 2018-11-10 NOTE — Patient Instructions (Signed)
Medication Instructions:  Your physician recommends that you continue on your current medications as directed. Please refer to the Current Medication list given to you today.  If you need a refill on your cardiac medications before your next appointment, please call your pharmacy.   Lab work: NONE If you have labs (blood work) drawn today and your tests are completely normal, you will receive your results only by: Marland Kitchen MyChart Message (if you have MyChart) OR . A paper copy in the mail If you have any lab test that is abnormal or we need to change your treatment, we will call you to review the results.  Testing/Procedures: Your physician has requested that you have en exercise stress myoview. For further information please visit [email or web address] Please follow instruction sheet, as given.  Your physician has requested that you have an ankle brachial index (ABI). During this test an ultrasound and blood pressure cuff are used to evaluate the arteries that supply the arms and legs with blood. Allow thirty minutes for this exam. There are no restrictions or special instructions.  A chest x-ray takes a picture of the organs and structures inside the chest, including the heart, lungs, and blood vessels. This test can show several things, including, whether the heart is enlarges; whether fluid is building up in the lungs; and whether pacemaker / defibrillator leads are still in place. [facility]: ADDRESS: [address] [place], [place] [address]    Follow-Up: At [facility], you and your health needs are our priority.  As part of our continuing mission to provide you with exceptional heart care, we have created designated Provider Care Teams.  These Care Teams include your primary Cardiologist (physician) and Advanced Practice Providers (APPs -  Physician Assistants and Nurse Practitioners) who all work together to provide you with the care you need, when you need it. You  will need a follow up appointment in:  4 - 6 weeks.  Please call our office 2 months in advance to schedule this appointment.  You may see Lesleigh Noe, MD or one of the following Advanced Practice Providers on your designated Care Team: Tereso Newcomer, PA-C [place], [place] . Berton Bon, NP  Any Other Special Instructions Will Be Listed Below (If Applicable).

## 2018-11-13 ENCOUNTER — Telehealth (HOSPITAL_COMMUNITY): Payer: Self-pay | Admitting: *Deleted

## 2018-11-13 NOTE — Telephone Encounter (Signed)
Patient given detailed instructions per Myocardial Perfusion Study Information Sheet for the test on 09/26/19. Patient notified to arrive 15 minutes early and that it is imperative to arrive on time for appointment to keep from having the test rescheduled.  If you need to cancel or reschedule your appointment, please call the office within 24 hours of your appointment. . Patient verbalized understanding. Alexander Larson Jacqueline    

## 2018-11-14 ENCOUNTER — Encounter (HOSPITAL_COMMUNITY): Payer: Commercial Managed Care - PPO

## 2018-11-15 ENCOUNTER — Ambulatory Visit (HOSPITAL_COMMUNITY)
Admission: RE | Admit: 2018-11-15 | Discharge: 2018-11-15 | Disposition: A | Discharge status: Home / Self Care | Payer: Commercial Managed Care - PPO | Source: Ambulatory Visit | Attending: Cardiology | Admitting: Cardiology

## 2018-11-15 ENCOUNTER — Encounter: Payer: Self-pay | Admitting: Physician Assistant

## 2018-11-15 ENCOUNTER — Ambulatory Visit (HOSPITAL_BASED_OUTPATIENT_CLINIC_OR_DEPARTMENT_OTHER): Payer: Commercial Managed Care - PPO

## 2018-11-15 DIAGNOSIS — R079 Chest pain, unspecified: Secondary | ICD-10-CM

## 2018-11-15 DIAGNOSIS — I739 Peripheral vascular disease, unspecified: Secondary | ICD-10-CM | POA: Insufficient documentation

## 2018-11-15 LAB — MYOCARDIAL PERFUSION IMAGING
CHL CUP MPHR: 160 {beats}/min
CHL CUP NUCLEAR SSS: 0
CHL CUP RESTING HR STRESS: 65 {beats}/min
CHL RATE OF PERCEIVED EXERTION: 18
CSEPED: 9 min
CSEPEDS: 45 s
CSEPEW: 10.1 METS
CSEPHR: 88 %
LV dias vol: 92 mL (ref 62–150)
LV sys vol: 39 mL
NUC STRESS TID: 0.9
Peak HR: 142 {beats}/min
SDS: 0
SRS: 0

## 2018-11-15 MED ORDER — TECHNETIUM TC 99M TETROFOSMIN IV KIT
10.4000 | PACK | Freq: Once | INTRAVENOUS | Status: AC | PRN
  Administered 2018-11-15: 10.4 via INTRAVENOUS
  Filled 2018-11-15: qty 11

## 2018-11-15 MED ORDER — TECHNETIUM TC 99M TETROFOSMIN IV KIT
31.9000 | PACK | Freq: Once | INTRAVENOUS | Status: AC | PRN
  Administered 2018-11-15: 31.9 via INTRAVENOUS
  Filled 2018-11-15: qty 32

## 2018-11-17 ENCOUNTER — Telehealth: Payer: Self-pay | Admitting: Interventional Cardiology

## 2018-11-17 NOTE — Telephone Encounter (Signed)
Returned pt's call and reviewed results with patient who verbalized understanding. A copy has been sent to pt PCP.

## 2018-11-17 NOTE — Telephone Encounter (Signed)
New Message     Patient returning phone call about test results.

## 2018-12-18 DIAGNOSIS — I739 Peripheral vascular disease, unspecified: Secondary | ICD-10-CM | POA: Insufficient documentation

## 2018-12-18 NOTE — Progress Notes (Signed)
Cardiology Office Note:    Date:  12/19/2018   ID:  Alexander Larson, DOB [birth year], MRN [id number]  PCP:  Salley Scarlet, MD  Cardiologist:  Lesleigh Noe, MD   Referring MD: Salley Scarlet, MD   Chief Complaint  Patient presents with  . Coronary Artery Disease    History of Present Illness:    Alexander Larson is a 58 y.o. male with a hx of coronary artery disease s/p inferior ST elevation myocardial infarction in 03/2017 treated with a DES to the RCA, hyperlipidemia, tobacco abuse.  He was last seen by Dr. Katrinka Blazing in 04/2018.  He called in recently with complaints of chest pain.   Nuclear stress test performed 11/15/2018.  He had chest discomfort that led to nuclear scintigraphy.  The study demonstrated no evidence of a prior myocardial infarction and was described as being normal.  And requestioning him concerning his chest complaint, we have a language barrier in reference to his being able to describe the discomfort.  I get the feeling that he is having palpitations.  He also at times will have a sensation of tightness in the chest that is very mild.  The chest tightness occurs randomly.  It lasts less than 10 minutes and is not associated with diaphoresis or palpitations.  Past Medical History:  Diagnosis Date  . Alcohol abuse 04/05/2017  . CAD (coronary artery disease)    04/05/17 PCI DES--RCA, EF 55% // Myoview 10/2018: EF 57, normal perfusion; Low Risk  . Constipation 04/15/2017  . Hyperlipidemia   . Mixed hyperlipidemia   . s/p Inferior STEMI 03/2017 tx with DES to RCA 04/05/2017  . Tobacco abuse 04/05/2017  . Varicose veins of both lower extremities with pain     Past Surgical History:  Procedure Laterality Date  . CORONARY STENT INTERVENTION N/A 04/05/2017   Procedure: Coronary Stent Intervention;  Surgeon: Lyn Records, MD;  [facility]VASIVE CV LAB;  Service: Cardiovascular;  Laterality: N/A;  . LEFT HEART CATH AND CORONARY ANGIOGRAPHY N/A 04/05/2017   Procedure:  Left Heart Cath and Coronary Angiography;  Surgeon: Lyn Records, MD;  Location: [facility] INVAS[facility] CV LAB;  Service: Cardiovascular;  Laterality: N/A;    Current Medications: Current Meds  Medication Sig  . aspirin 81 MG chewable tablet Chew 1 tablet (81 mg total) by mouth daily.  Marland Kitchen atorvastatin (LIPITOR) 80 MG tablet TAKE 1 TABLET (80 MG TOTAL) BY MOUTH DAILY AT 6 PM.  . clopidogrel (PLAVIX) 75 MG tablet Take 75 mg by mouth daily.  . ergocalciferol (VITAMIN D2) 1.25 MG (50000 UT) capsule Vitamin D2 1,250 mcg (50,000 unit) capsule  Take 1 capsule every week by oral route as directed for 60 days.  Marland Kitchen HYDROcodone-acetaminophen (NORCO/VICODIN) 5-325 MG tablet Take 1 tablet by mouth every 6 (six) hours as needed for moderate pain or severe pain.  Marland Kitchen lisinopril (PRINIVIL,ZESTRIL) 2.5 MG tablet TAKE 1 TABLET BY MOUTH EVERY DAY  . nitroGLYCERIN (NITROSTAT) 0.4 MG SL tablet Place 1 tablet (0.4 mg total) under the tongue every 5 (five) minutes as needed.  Marland Kitchen omega-3 acid ethyl esters (LOVAZA) 1 g capsule Take 2 capsules (2 g total) by mouth daily.  . [DISCONTINUED] nitroGLYCERIN (NITROSTAT) 0.4 MG SL tablet Place 1 tablet (0.4 mg total) under the tongue every 5 (five) minutes as needed.     Allergies:   Carvedilol   Social History   Socioeconomic History  . Marital status: Married    Spouse name: Not on file  .  Number of children: Not on file  . Years of education: Not on file  . Highest education level: Not on file  Occupational History  . Not on file  Social Needs  . Financial resource strain: Not on file  . Food insecurity:    Worry: Not on file    Inability: Not on file  . Transportation needs:    Medical: Not on file    Non-medical: Not on file  Tobacco Use  . Smoking status: Current Some Day Smoker    Types: Cigarettes  . Smokeless tobacco: Never Used  Substance and Sexual Activity  . Alcohol use: Yes    Alcohol/week: 12.0 standard drinks    Types: 12 Cans of beer per week     Comment: 1 a day  . Drug use: No  . Sexual activity: Not Currently  Lifestyle  . Physical activity:    Days per week: Not on file    Minutes per session: Not on file  . Stress: Not on file  Relationships  . Social connections:    Talks on phone: Not on file    Gets together: Not on file    Attends religious service: Not on file    Active member of club or organization: Not on file    Attends meetings of clubs or organizations: Not on file    Relationship status: Not on file  Other Topics Concern  . Not on file  Social History Narrative  . Not on file     Family History: The patient's family history includes Cancer in his father; Hypertension in his father.  ROS:   Please see the history of present illness.    Right leg pain consistent with sciatica all other systems reviewed and are negative.  EKGs/Labs/Other Studies Reviewed:    The following studies were reviewed today:  Nuclear stress test November 15, 2018: Study Highlights    The patient had good exercise capacity achieving 10.1 mets at an adequate HR of 88% PMHR with no symptoms.  Blood pressure demonstrated a hypertensive response to exercise.  There was no ST segment deviation noted during stress.  The left ventricular ejection fraction is normal (55-65%).  Nuclear stress EF: 57%.  The study is normal.  This is a low risk study.   November 16, 2018 lower extremity vascular Doppler study: Summary: Right: Resting right ankle-brachial index is within normal range. No evidence of significant right lower extremity arterial disease. The right toe-brachial index is abnormal.  Left: Resting left ankle-brachial index is within normal range. No evidence of significant left lower extremity arterial disease. The left toe-brachial index is abnormal.   EKG:  EKG not repeated  Recent Labs: 05/17/2018: ALT 33  Recent Lipid Panel    Component Value Date/Time   CHOL 181 05/17/2018 1637   TRIG 244 (H)  05/17/2018 1637   HDL 45 05/17/2018 1637   CHOLHDL 4.0 05/17/2018 1637   CHOLHDL 6.2 04/05/2017 0803   VLDL 60 (H) 04/05/2017 0803   LDLCALC 87 05/17/2018 1637    Physical Exam:    VS:  BP 126/72   Pulse 87   Ht 5\' 6"  (1.676 m)   Wt 163 lb (73.9 kg)   SpO2 97%   BMI 26.31 kg/m     Wt Readings from Last 3 Encounters:  12/19/18 163 lb (73.9 kg)  11/15/18 158 lb (71.7 kg)  11/10/18 158 lb 1.9 oz (71.7 kg)     GEN: Healthy-appearing. No acute distress HEENT: Normal  NECK: No JVD. LYMPHATICS: No lymphadenopathy CARDIAC: RRR.  No murmur, no gallop, no edema VASCULAR: 2+ bilateral radial and carotid pulses, no bruits RESPIRATORY:  Clear to auscultation without rales, wheezing or rhonchi  ABDOMEN: Soft, non-tender, non-distended, No pulsatile mass, MUSCULOSKELETAL: No deformity  SKIN: Warm and dry NEUROLOGIC:  Alert and oriented x 3 PSYCHIATRIC:  Normal affect   ASSESSMENT:    1. s/p Inferior STEMI 03/2017 tx with DES to RCA   2. Tobacco abuse   3. Alcohol abuse   4. Mixed hyperlipidemia   5. PAD (peripheral artery disease) (HCC)    PLAN:    In order of problems listed above:  1. No evidence of ischemia on recent nuclear study.  Clinical observation.  Use nitro if prolonged discomfort.  5-month follow-up.  Keep a log of how often nitrates use and whether or not it seems to help. 2. We need to encourage the patient to discontinue smoking. 3. Alcohol was not addressed. 4. LDL target is less than 70.  Last LDL was 87 in July 2019.  This will be readdressed at the next office visit. 5. There is no evidence of vascular obstruction based on Doppler study.  Needs to be evaluated for sciatica.  Will refer this to the patient's primary care.  31-month follow-up of cardiovascular issues.  Strong counseling concerning secondary prevention especially cigarette smoking will be needed.   Medication Adjustments/Labs and Tests Ordered: Current medicines are reviewed at length with the  patient today.  Concerns regarding medicines are outlined above.  No orders of the defined types were placed in this encounter.  Meds ordered this encounter  Medications  . nitroGLYCERIN (NITROSTAT) 0.4 MG SL tablet    Sig: Place 1 tablet (0.4 mg total) under the tongue every 5 (five) minutes as needed.    Dispense:  25 tablet    Refill:  3    Patient Instructions  Medication Instructions:  Your physician recommends that you continue on your current medications as directed. Please refer to the Current Medication list given to you today.  If you need a refill on your cardiac medications before your next appointment, please call your pharmacy.   Lab work: None If you have labs (blood work) drawn today and your tests are completely normal, you will receive your results only by: Marland Kitchen MyChart Message (if you have MyChart) OR . A paper copy in the mail If you have any lab test that is abnormal or we need to change your treatment, we will call you to review the results.  Testing/Procedures: None  Follow-Up: At [facility], you and your health needs are our priority.  As part of our continuing mission to provide you with exceptional heart care, we have created designated Provider Care Teams.  These Care Teams include your primary Cardiologist (physician) and Advanced Practice Providers (APPs -  Physician Assistants and Nurse Practitioners) who all work together to provide you with the care you need, when you need it. You will need a follow up appointment in 3 months.  Please call our office 2 months in advance to schedule this appointment.  You may see Lesleigh Noe, MD or one of the following Advanced Practice Providers on your designated Care Team:   Norma Fredrickson, NP Nada Boozer, NP . Georgie Chard, NP  Any Other Special Instructions Will Be Listed Below (If Applicable).       Signed, Lesleigh Noe, MD  12/19/2018 11:57 AM    [facility]

## 2018-12-19 ENCOUNTER — Encounter: Payer: Self-pay | Admitting: Interventional Cardiology

## 2018-12-19 ENCOUNTER — Ambulatory Visit (INDEPENDENT_AMBULATORY_CARE_PROVIDER_SITE_OTHER): Payer: Commercial Managed Care - PPO | Admitting: Interventional Cardiology

## 2018-12-19 VITALS — BP 126/72 | HR 87 | Ht 66.0 in | Wt 163.0 lb

## 2018-12-19 DIAGNOSIS — Z720 Tobacco use: Secondary | ICD-10-CM | POA: Diagnosis not present

## 2018-12-19 DIAGNOSIS — E782 Mixed hyperlipidemia: Secondary | ICD-10-CM | POA: Diagnosis not present

## 2018-12-19 DIAGNOSIS — I252 Old myocardial infarction: Secondary | ICD-10-CM | POA: Diagnosis not present

## 2018-12-19 DIAGNOSIS — F1010 Alcohol abuse, uncomplicated: Secondary | ICD-10-CM

## 2018-12-19 DIAGNOSIS — I739 Peripheral vascular disease, unspecified: Secondary | ICD-10-CM

## 2018-12-19 MED ORDER — NITROGLYCERIN 0.4 MG SL SUBL: 0.4000 mg | SUBLINGUAL_TABLET | SUBLINGUAL | 3 refills | Status: DC | PRN

## 2018-12-19 NOTE — Patient Instructions (Signed)
Medication Instructions:  Your physician recommends that you continue on your current medications as directed. Please refer to the Current Medication list given to you today.  If you need a refill on your cardiac medications before your next appointment, please call your pharmacy.   Lab work: None If you have labs (blood work) drawn today and your tests are completely normal, you will receive your results only by: . MyChart Message (if you have MyChart) OR . A paper copy in the mail If you have any lab test that is abnormal or we need to change your treatment, we will call you to review the results.  Testing/Procedures: None  Follow-Up: At [facility], you and your health needs are our priority.  As part of our continuing mission to provide you with exceptional heart care, we have created designated Provider Care Teams.  These Care Teams include your primary Cardiologist (physician) and Advanced Practice Providers (APPs -  Physician Assistants and Nurse Practitioners) who all work together to provide you with the care you need, when you need it. You will need a follow up appointment in 3 months.  Please call our office 2 months in advance to schedule this appointment.  You may see Henry W Smith III, MD or one of the following Advanced Practice Providers on your designated Care Team:   Lori Gerhardt, NP Laura Ingold, NP . Jill McDaniel, NP  Any Other Special Instructions Will Be Listed Below (If Applicable).    

## 2018-12-26 ENCOUNTER — Other Ambulatory Visit: Payer: Self-pay

## 2018-12-26 ENCOUNTER — Encounter: Payer: Self-pay | Admitting: Family Medicine

## 2018-12-26 ENCOUNTER — Ambulatory Visit (INDEPENDENT_AMBULATORY_CARE_PROVIDER_SITE_OTHER): Payer: Commercial Managed Care - PPO | Admitting: Family Medicine

## 2018-12-26 VITALS — BP 126/68 | HR 66 | Temp 98.6°F | Resp 16 | Ht 66.0 in | Wt 162.0 lb

## 2018-12-26 DIAGNOSIS — M5431 Sciatica, right side: Secondary | ICD-10-CM | POA: Diagnosis not present

## 2018-12-26 NOTE — Patient Instructions (Signed)
Referral to orthopedics  F/U 6 months

## 2018-12-26 NOTE — Progress Notes (Signed)
   Subjective:    Patient ID: Alexander Larson, male    DOB: [birth year], 58 y.o.   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 He has vascular studies by cardiology no PVD. He does get numbness at times in his right foot. No change in bowel or bladder,  Would like referral to back specialist       Review Of Systems:  GEN- denies fatigue, fever, weight loss,weakness, recent illness HEENT- denies eye drainage, change in vision, nasal discharge, CVS- denies chest pain, palpitations RESP- denies SOB, cough, wheeze ABD- denies N/V, change in stools, abd pain GU- denies dysuria, hematuria, dribbling, incontinence MSK- + joint pain, muscle aches, injury Neuro- denies headache, dizziness, syncope, seizure activity       Objective:    BP 126/68   Pulse 66   Temp 98.6 F (37 C) (Oral)   Resp 16   Ht 5\' 6"  (1.676 m)   Wt 162 lb (73.5 kg)   SpO2 98%   BMI 26.15 kg/m  GEN- NAD, alert and oriented x3 HEENT- PERRL, EOMI, non injected sclera, pink conjunctiva, MMM, oropharynx clear CVS- RRR, no murmur RESP-CTAB ABD-NABS,soft,NT,ND MSK- Lumbar spine NT, Hip/knee  fair ROM, back fair ROM, neg SLR,  Neuro Decreased strength RLE 4+/5 compared to left, normal tone, sensation grossly in tact  EXT- No edema Pulses- Radial, DP- 2+        Assessment & Plan:      Problem List Items Addressed This Visit    None    Visit Diagnoses    Back pain with right-sided sciatica    -  Primary   referral to orthopedics, no red flags, suspect he has DDD with nerve impingment, very physical job, wants to hold on starting daily med      Note: This dictation was prepared with Nurse, children's dictation along with smaller Lobbyist. Any transcriptional errors  that result from this process are unintentional.

## 2019-01-02 ENCOUNTER — Ambulatory Visit (INDEPENDENT_AMBULATORY_CARE_PROVIDER_SITE_OTHER): Payer: Commercial Managed Care - PPO

## 2019-01-02 ENCOUNTER — Ambulatory Visit (INDEPENDENT_AMBULATORY_CARE_PROVIDER_SITE_OTHER): Payer: Commercial Managed Care - PPO | Admitting: Family Medicine

## 2019-01-02 ENCOUNTER — Encounter (INDEPENDENT_AMBULATORY_CARE_PROVIDER_SITE_OTHER): Payer: Self-pay | Admitting: Family Medicine

## 2019-01-02 DIAGNOSIS — M5441 Lumbago with sciatica, right side: Secondary | ICD-10-CM

## 2019-01-02 DIAGNOSIS — G8929 Other chronic pain: Secondary | ICD-10-CM | POA: Diagnosis not present

## 2019-01-02 MED ORDER — GABAPENTIN 100 MG PO CAPS: ORAL_CAPSULE | ORAL | 1 refills | Status: DC

## 2019-01-02 NOTE — Patient Instructions (Signed)
   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  Treatment:  Physical therapy; gabapentin.  If pain persists, we will order MRI scan and then possibly make referral for cortisone injection in spine.

## 2019-01-02 NOTE — Progress Notes (Signed)
I saw and examined the patient with Dr. Jamse Mead and agree with assessment and plan as outlined.  Lumbar DDD with foraminal spurring at multiple levels, right-sided sciatica.  Trial of PT, gabapentin.  If not improving, then MRI and possible ESI.

## 2019-01-02 NOTE — Progress Notes (Signed)
  Alexander Larson - 58 y.o. male MRN [id number]  Date of birth: [birth year]    SUBJECTIVE:      Chief Complaint: back pain radiating into leg  HPI:  58 year old male with right-sided low back pain radiating into the right leg down to the foot.  He states his pain is been present for over the past year.  He denies any injury to his back.  He works in Research scientist (physical sciences).  His radiating pain is located in the posterior lateral leg.  His pain is worse with prolonged walking or prolonged sitting.  He has not found anything that provides significant relief.  He has not done physical therapy previously for this.  He does feel at times some weakness in the right lower leg.  No significant numbness or tingling, just rating pain.  No skin changes.  No bowel or bladder symptoms.  No saddle anesthesia.     ROS:     See HPI  PERTINENT  PMH / PSH FH / / SH:  Past Medical, Surgical, Social, and Family History Reviewed & Updated in the EMR.    OBJECTIVE: There were no vitals taken for this visit.  Physical Exam:  Vital signs are reviewed.  GEN: Alert and oriented, NAD Pulm: Breathing unlabored PSY: normal mood, congruent affect  MSK: Lumbar spine: - Inspection: no gross deformity or asymmetry, swelling or ecchymosis - Palpation: midline TTP over L4-S1 region, no focal spinous process tenderness. Bilateral paraspinal muscle TTP at L4-L5. No SI joint TTP - ROM: normal ROM - Strength: 5/5 strength of lower extremity in L4-S1 nerve root distributions b/l; normal gait - Neuro: sensation intact in the L4-S1 nerve root distribution b/l, 2+ L4 and S1 reflexes - Special testing: Negative straight leg raise   Right hip: normal passive ROM without pain, 5/5 strength Left hip: normal passive ROM without pain, 5/5 strength   ASSESSMENT & PLAN:  1. Low back pain with radiculopathy - no weakness on exam. No red flag symptoms .lumbar xrays obtained today show significant DDD of the lumbar spine. Anterior  spurring noted. There Is also spurring posteriorly into the foramen, most notable at L3/3, L4/5, L5/S1 - gabapentin 100mg  qHS then titrate to TID - referral to physical therapy - follow up 4- 6wks as needed, consider CSI if not improving

## 2019-01-25 ENCOUNTER — Telehealth (INDEPENDENT_AMBULATORY_CARE_PROVIDER_SITE_OTHER): Payer: Self-pay

## 2019-01-25 NOTE — Telephone Encounter (Signed)
Received fax from patients pharmacy [facility] [address] asking for refill on behalf of patient for Gabapentin 100mg  1 at bedtime and may increase to 1 capsule tid prn  #90

## 2019-01-26 MED ORDER — GABAPENTIN 100 MG PO CAPS: ORAL_CAPSULE | ORAL | 1 refills | Status: DC

## 2019-01-26 NOTE — Telephone Encounter (Signed)
Rx sent 

## 2019-01-26 NOTE — Addendum Note (Signed)
Addended by: Lillia Carmel on: 01/26/2019 07:59 AM   Modules accepted: Orders

## 2019-01-29 ENCOUNTER — Telehealth (INDEPENDENT_AMBULATORY_CARE_PROVIDER_SITE_OTHER): Payer: Self-pay | Admitting: Radiology

## 2019-01-29 MED ORDER — GABAPENTIN 100 MG PO CAPS: 100.0000 mg | ORAL_CAPSULE | Freq: Three times a day (TID) | ORAL | 1 refills | Status: DC

## 2019-01-29 NOTE — Telephone Encounter (Signed)
Sent!

## 2019-01-29 NOTE — Telephone Encounter (Signed)
Pharmacy request 90 day supply authorization request  Gabapentin 100mg  capsule Qty: 270 Sig: take 1 capsule by mouth at bedtime, may increase to 1 capsule 3 times a day if needed

## 2019-03-06 ENCOUNTER — Other Ambulatory Visit: Payer: Self-pay | Admitting: Interventional Cardiology

## 2019-03-06 MED ORDER — ATORVASTATIN CALCIUM 80 MG PO TABS: 80.0000 mg | ORAL_TABLET | Freq: Every day | ORAL | 3 refills | Status: DC

## 2019-03-15 ENCOUNTER — Other Ambulatory Visit: Payer: Self-pay | Admitting: Interventional Cardiology

## 2019-03-16 ENCOUNTER — Telehealth: Payer: Self-pay

## 2019-03-16 NOTE — Telephone Encounter (Signed)
YOUR CARDIOLOGY TEAM HAS ARRANGED FOR AN E-VISIT FOR YOUR APPOINTMENT - PLEASE REVIEW IMPORTANT INFORMATION BELOW SEVERAL DAYS PRIOR TO YOUR APPOINTMENT  Due to the recent COVID-19 pandemic, we are transitioning in-person office visits to tele-medicine visits in an effort to decrease unnecessary exposure to our patients, their families, and staff. These visits are billed to your insurance just like a normal visit is. We also encourage you to sign up for MyChart if you have not already done so. You will need a smartphone if possible. For patients that do not have this, we can still complete the visit using a regular telephone but do prefer a smartphone to enable video when possible. You may have a family member that lives with you that can help. If possible, we also ask that you have a blood pressure cuff and scale at home to measure your blood pressure, heart rate and weight prior to your scheduled appointment. Patients with clinical needs that need an in-person evaluation and testing will still be able to come to the office if absolutely necessary. If you have any questions, feel free to call our office.     YOUR PROVIDER WILL BE USING THE FOLLOWING PLATFORM TO COMPLETE YOUR VISIT: Doximity  . IF USING MYCHART - How to Download the MyChart App to Your SmartPhone   - If Apple, go to App Store and type in MyChart in the search bar and download the app. If Android, ask patient to go to Google Play Store and type in MyChart in the search bar and download the app. The app is free but as with any other app downloads, your phone may require you to verify saved payment information or Apple/Android password.  - You will need to then log into the app with your MyChart username and password, and select [facility] as your healthcare provider to link the account.  - When it is time for your visit, go to the MyChart app, find appointments, and click Begin Video Visit. Be sure to Select Allow for your device to  access the Microphone and Camera for your visit. You will then be connected, and your provider will be with you shortly.  **If you have any issues connecting or need assistance, please contact MyChart service desk [phone] ([phone])**  **If using a computer, in order to ensure the best quality for your visit, you will need to use either of the following Internet Browsers: Google Chrome or Microsoft Edge**  . IF USING DOXIMITY or [email or web address].ME - The staff will give you instructions on receiving your link to join the meeting the day of your visit.      2-3 DAYS BEFORE YOUR APPOINTMENT  You will receive a telephone call from one of our [facility] team members - your caller ID may say "Unknown caller." If this is a video visit, we will walk you through how to get the video launched on your phone. We will remind you check your blood pressure, heart rate and weight prior to your scheduled appointment. If you have an Apple Watch or Kardia, please upload any pertinent ECG strips the day before or morning of your appointment to MyChart. Our staff will also make sure you have reviewed the consent and agree to move forward with your scheduled tele-health visit.     THE DAY OF YOUR APPOINTMENT  Approximately 15 minutes prior to your scheduled appointment, you will receive a telephone call from one of Heart[facility] team - your caller ID may say "Unknown caller."    Our staff will confirm medications, vital signs for the day and any symptoms you may be experiencing. Please have this information available prior to the time of visit start. It may also be helpful for you to have a pad of paper and pen handy for any instructions given during your visit. They will also walk you through joining the smartphone meeting if this is a video visit.    CONSENT FOR TELE-HEALTH VISIT - PLEASE REVIEW  I hereby voluntarily request, consent and authorize [facility] and its employed or contracted physicians, physician  assistants, nurse practitioners or other licensed health care professionals (the Practitioner), to provide me with telemedicine health care services (the "Services") as deemed necessary by the treating Practitioner. I acknowledge and consent to receive the Services by the Practitioner via telemedicine. I understand that the telemedicine visit will involve communicating with the Practitioner through live audiovisual communication technology and the disclosure of certain medical information by electronic transmission. I acknowledge that I have been given the opportunity to request an in-person assessment or other available alternative prior to the telemedicine visit and am voluntarily participating in the telemedicine visit.  I understand that I have the right to withhold or withdraw my consent to the use of telemedicine in the course of my care at any time, without affecting my right to future care or treatment, and that the Practitioner or I may terminate the telemedicine visit at any time. I understand that I have the right to inspect all information obtained and/or recorded in the course of the telemedicine visit and may receive copies of available information for a reasonable fee.  I understand that some of the potential risks of receiving the Services via telemedicine include:  . Delay or interruption in medical evaluation due to technological equipment failure or disruption; . Information transmitted may not be sufficient (e.g. poor resolution of images) to allow for appropriate medical decision making by the Practitioner; and/or  . In rare instances, security protocols could fail, causing a breach of personal health information.  Furthermore, I acknowledge that it is my responsibility to provide information about my medical history, conditions and care that is complete and accurate to the best of my ability. I acknowledge that Practitioner's advice, recommendations, and/or decision may be based on  factors not within their control, such as incomplete or inaccurate data provided by me or distortions of diagnostic images or specimens that may result from electronic transmissions. I understand that the practice of medicine is not an exact science and that Practitioner makes no warranties or guarantees regarding treatment outcomes. I acknowledge that I will receive a copy of this consent concurrently upon execution via email to the email address I last provided but may also request a printed copy by calling the office of [facility].    I understand that my insurance will be billed for this visit.   I have read or had this consent read to me. . I understand the contents of this consent, which adequately explains the benefits and risks of the Services being provided via telemedicine.  . I have been provided ample opportunity to ask questions regarding this consent and the Services and have had my questions answered to my satisfaction. . I give my informed consent for the services to be provided through the use of telemedicine in my medical care  By participating in this telemedicine visit I agree to the above.  

## 2019-03-29 ENCOUNTER — Telehealth (INDEPENDENT_AMBULATORY_CARE_PROVIDER_SITE_OTHER): Payer: Commercial Managed Care - PPO | Admitting: Interventional Cardiology

## 2019-03-29 ENCOUNTER — Encounter: Payer: Self-pay | Admitting: Interventional Cardiology

## 2019-03-29 ENCOUNTER — Other Ambulatory Visit: Payer: Self-pay

## 2019-03-29 VITALS — BP 159/93 | HR 99 | Ht 66.0 in | Wt 163.0 lb

## 2019-03-29 DIAGNOSIS — Z7189 Other specified counseling: Secondary | ICD-10-CM

## 2019-03-29 DIAGNOSIS — Z720 Tobacco use: Secondary | ICD-10-CM

## 2019-03-29 DIAGNOSIS — I739 Peripheral vascular disease, unspecified: Secondary | ICD-10-CM

## 2019-03-29 DIAGNOSIS — E782 Mixed hyperlipidemia: Secondary | ICD-10-CM

## 2019-03-29 DIAGNOSIS — F1010 Alcohol abuse, uncomplicated: Secondary | ICD-10-CM

## 2019-03-29 DIAGNOSIS — I2510 Atherosclerotic heart disease of native coronary artery without angina pectoris: Secondary | ICD-10-CM | POA: Diagnosis not present

## 2019-03-29 DIAGNOSIS — I10 Essential (primary) hypertension: Secondary | ICD-10-CM

## 2019-03-29 MED ORDER — LISINOPRIL 10 MG PO TABS: 10.0000 mg | ORAL_TABLET | Freq: Every day | ORAL | 3 refills | Status: DC

## 2019-03-29 NOTE — Patient Instructions (Addendum)
Medication Instructions:  1) INCREASE Lisinopril to 10mg  once daily 2) DISCONTINUE Plavix  If you need a refill on your cardiac medications before your next appointment, please call your pharmacy.   Lab work: Your physician recommends that you return for lab work in: July.  You will need to be fasting for these labs (nothing to eat or drink after midnight except water and black coffee)  If you have labs (blood work) drawn today and your tests are completely normal, you will receive your results only by: Marland Kitchen MyChart Message (if you have MyChart) OR . A paper copy in the mail If you have any lab test that is abnormal or we need to change your treatment, we will call you to review the results.  Testing/Procedures: None  Follow-Up: At [facility], you and your health needs are our priority.  As part of our continuing mission to provide you with exceptional heart care, we have created designated Provider Care Teams.  These Care Teams include your primary Cardiologist (physician) and Advanced Practice Providers (APPs -  Physician Assistants and Nurse Practitioners) who all work together to provide you with the care you need, when you need it. You will need a follow up appointment in 9 months.  Please call our office 2 months in advance to schedule this appointment.  You may see Lesleigh Noe, MD or one of the following Advanced Practice Providers on your designated Care Team:   Norma Fredrickson, NP Nada Boozer, NP . Georgie Chard, NP  Any Other Special Instructions Will Be Listed Below (If Applicable).

## 2019-03-29 NOTE — Progress Notes (Signed)
Virtual Visit via Video Note   This visit type was conducted due to national recommendations for restrictions regarding the COVID-19 Pandemic (e.g. social distancing) in an effort to limit this patient's exposure and mitigate transmission in our community.  Due to his co-morbid illnesses, this patient is at least at moderate risk for complications without adequate follow up.  This format is felt to be most appropriate for this patient at this time.  All issues noted in this document were discussed and addressed.  A limited physical exam was performed with this format.  Please refer to the patient's chart for his consent to telehealth for [facility].   Date:  03/29/2019   ID:  Alexander Larson, DOB [birth year], MRN [id number]  Patient Location: Home Provider Location: Home  PCP:  Salley Scarlet, MD  Cardiologist:  Lesleigh Noe, MD  Electrophysiologist:  None   Evaluation Performed:  Follow-Up Visit  Chief Complaint:  CAD  History of Present Illness:    Alexander Larson is a 58 y.o. male with with a hx of coronary artery diseases/p inferior ST elevationmyocardial infarctionin 03/2017 treated with a DES to the RCA,hyperlipidemia, tobacco abuse. He was last seen by Dr. Katrinka Blazing in 04/2018. He called in recently with complaints of chest pain.  Nuclear stress test performed 11/15/2018 which was negative for ischemia.  After last office visit, sublingual nitroglycerin was prescribed.  Since being prescribed the nitroglycerin he has not used it.  He has been working regularly.  He may have occasional "indigestion".  This is not exertion related.  He denies dyspnea.  He is compliant with his medications.  Blood pressures typically run 135-145 /80 to 90 mmHg.   The patient does not have symptoms concerning for COVID-19 infection (fever, chills, cough, or new shortness of breath).    Past Medical History:  Diagnosis Date  . Alcohol abuse 04/05/2017  . CAD (coronary artery disease)    04/05/17 PCI DES--RCA, EF 55% // Myoview 10/2018: EF 57, normal perfusion; Low Risk  . Constipation 04/15/2017  . Hyperlipidemia   . Mixed hyperlipidemia   . s/p Inferior STEMI 03/2017 tx with DES to RCA 04/05/2017  . Tobacco abuse 04/05/2017  . Varicose veins of both lower extremities with pain    Past Surgical History:  Procedure Laterality Date  . CORONARY STENT INTERVENTION N/A 04/05/2017   Procedure: Coronary Stent Intervention;  Surgeon: Lyn Records, MD;  Location[facility]AS[facility] CV LAB;  Service: Cardiovascular;  Laterality: N/A;  . LEFT HEART CATH AND CORONARY ANGIOGRAPHY N/A 04/05/2017   Procedure: Left Heart Cath and Coronary Angiography;  Surgeon: Lyn Records, MD;  Location: [facility] INVASIVE CV LAB;  Service: Cardiovascular;  Laterality: N/A;     Current Meds  Medication Sig  . aspirin 81 MG chewable tablet Chew 1 tablet (81 mg total) by mouth daily.  Marland Kitchen atorvastatin (LIPITOR) 80 MG tablet Take 1 tablet (80 mg total) by mouth daily at 6 PM.  . clopidogrel (PLAVIX) 75 MG tablet Take 75 mg by mouth daily.  . ergocalciferol (VITAMIN D2) 1.25 MG (50000 UT) capsule Vitamin D2 1,250 mcg (50,000 unit) capsule  Take 1 capsule every week by oral route as directed for 60 days.  Marland Kitchen gabapentin (NEURONTIN) 100 MG capsule Take 1 capsule (100 mg total) by mouth 3 (three) times daily. 1 PO q HS, may increase to 1 PO TID if needed  . HYDROcodone-acetaminophen (NORCO/VICODIN) 5-325 MG tablet Take 1 tablet by mouth every 6 (six) hours as  needed for moderate pain or severe pain.  . nitroGLYCERIN (NITROSTAT) 0.4 MG SL tablet PLACE 1 TABLET (0.4 MG TOTAL) UNDER THE TONGUE EVERY 5 (FIVE) MINUTES AS NEEDED.  Marland Kitchen omega-3 acid ethyl esters (LOVAZA) 1 g capsule Take 2 capsules (2 g total) by mouth daily.  . [DISCONTINUED] lisinopril (PRINIVIL,ZESTRIL) 2.5 MG tablet TAKE 1 TABLET BY MOUTH EVERY DAY     Allergies:   Carvedilol   Social History   Tobacco Use  . Smoking status: Current Some Day Smoker    Types:  Cigarettes  . Smokeless tobacco: Never Used  Substance Use Topics  . Alcohol use: Yes    Alcohol/week: 12.0 standard drinks    Types: 12 Cans of beer per week    Comment: 1 a day  . Drug use: No     Family Hx: The patient's family history includes Cancer in his father; Hypertension in his father.  ROS:   Please see the history of present illness.    None All other systems reviewed and are negative.   Prior CV studies:   The following studies were reviewed today:  None  Labs/Other Tests and Data Reviewed:    EKG:  No ECG reviewed.  Recent Labs: 05/17/2018: ALT 33   Recent Lipid Panel Lab Results  Component Value Date/Time   CHOL 181 05/17/2018 04:37 PM   TRIG 244 (H) 05/17/2018 04:37 PM   HDL 45 05/17/2018 04:37 PM   CHOLHDL 4.0 05/17/2018 04:37 PM   CHOLHDL 6.2 04/05/2017 08:03 AM   LDLCALC 87 05/17/2018 04:37 PM    Wt Readings from Last 3 Encounters:  03/29/19 163 lb (73.9 kg)  12/26/18 162 lb (73.5 kg)  12/19/18 163 lb (73.9 kg)     Objective:    Vital Signs:  BP (!) 159/93   Pulse 99   Ht 5\' 6"  (1.676 m)   Wt 163 lb (73.9 kg)   BMI 26.31 kg/m    VITAL SIGNS:  reviewed GEN:  no acute distress RESPIRATORY:  normal respiratory effort, symmetric expansion CARDIOVASCULAR:  no peripheral edema NEURO:  alert and oriented x 3, no obvious focal deficit  ASSESSMENT & PLAN:    1. Coronary artery disease involving native coronary artery of native heart without angina pectoris   2. PAD (peripheral artery disease) (HCC)   3. Tobacco abuse   4. Alcohol abuse   5. Mixed hyperlipidemia   6. Essential hypertension   7. Educated About Covid-19 Virus Infection    PLAN:  1. He is active and seems stable.  We discussed secondary risk prevention and metrics that should be achieved.  See outlined below. 2. Lower extremity evaluation for PAD was negative.  No current complaints of leg pain. 3. Difficult to tell if he has discontinued smoking.  Somewhat evasive.  4. Not discussed 5. LDL target less than 70.  Will be reevaluated in July. 6. Target blood pressure 130/80 mmHg.  Discussed low-salt diet, avoidance of alcohol, and nonsteroidal anti-inflammatory therapy.  Increase lisinopril to 10 mg/day.  Comprehensive metabolic panel along with lipid panel in July.  Overall education and awareness concerning primary/secondary risk prevention was discussed in detail: LDL less than 70, hemoglobin A1c less than 7, blood pressure target less than 130/80 mmHg, >150 minutes of moderate aerobic activity per week, avoidance of smoking, weight control (via diet and exercise), and continued surveillance/management of/for obstructive sleep apnea.   COVID-19 Education: The signs and symptoms of COVID-19 were discussed with the patient and how to seek  care for testing (follow up with PCP or arrange E-visit).  The importance of social distancing was discussed today.  Time:   Today, I have spent 15 minutes with the patient with telehealth technology discussing the above problems.     Medication Adjustments/Labs and Tests Ordered: Current medicines are reviewed at length with the patient today.  Concerns regarding medicines are outlined above.   Tests Ordered: Orders Placed This Encounter  Procedures  . Hepatic function panel  . Lipid panel  . Basic metabolic panel    Medication Changes: Meds ordered this encounter  Medications  . lisinopril (ZESTRIL) 10 MG tablet    Sig: Take 1 tablet (10 mg total) by mouth daily.    Dispense:  90 tablet    Refill:  3    Dose change    Disposition:  Follow up in 9 month(s)  Signed, Lesleigh NoeHenry W Priyal Musquiz III, MD  03/29/2019 3:25 PM    [facility]

## 2019-04-04 ENCOUNTER — Ambulatory Visit: Payer: Commercial Managed Care - PPO

## 2019-04-04 ENCOUNTER — Other Ambulatory Visit: Payer: Self-pay

## 2019-04-04 DIAGNOSIS — W57XXXA Bitten or stung by nonvenomous insect and other nonvenomous arthropods, initial encounter: Secondary | ICD-10-CM

## 2019-04-04 NOTE — Progress Notes (Signed)
Patient came in to have me look at his right foot where he had a tick bite. Site was very small with appearance of a mosquito bite. Patient states that he has been scratching it due to redness. Advised patient to apply alcohol and anti itch cream and if site grew larger to come in and see a provider. Patient verbalized understanding.

## 2019-04-26 ENCOUNTER — Other Ambulatory Visit: Payer: Self-pay | Admitting: Interventional Cardiology

## 2019-05-10 ENCOUNTER — Other Ambulatory Visit: Payer: Self-pay | Admitting: Interventional Cardiology

## 2019-05-26 ENCOUNTER — Other Ambulatory Visit: Payer: Self-pay | Admitting: Interventional Cardiology

## 2019-05-28 ENCOUNTER — Other Ambulatory Visit: Payer: Self-pay

## 2019-05-28 MED ORDER — LISINOPRIL 10 MG PO TABS: 10.0000 mg | ORAL_TABLET | Freq: Every day | ORAL | 3 refills | Status: DC

## 2019-06-05 ENCOUNTER — Other Ambulatory Visit: Payer: Self-pay | Admitting: Interventional Cardiology

## 2019-06-12 ENCOUNTER — Other Ambulatory Visit: Payer: Commercial Managed Care - PPO

## 2019-06-12 ENCOUNTER — Other Ambulatory Visit: Payer: Self-pay

## 2019-06-12 DIAGNOSIS — E782 Mixed hyperlipidemia: Secondary | ICD-10-CM

## 2019-06-12 DIAGNOSIS — I2510 Atherosclerotic heart disease of native coronary artery without angina pectoris: Secondary | ICD-10-CM

## 2019-06-13 LAB — HEPATIC FUNCTION PANEL
ALT: 29 IU/L (ref 0–44)
AST: 40 IU/L (ref 0–40)
Albumin: 4.2 g/dL (ref 3.8–4.9)
Alkaline Phosphatase: 106 IU/L (ref 39–117)
Bilirubin Total: 0.4 mg/dL (ref 0.0–1.2)
Bilirubin, Direct: 0.16 mg/dL (ref 0.00–0.40)
Total Protein: 6.7 g/dL (ref 6.0–8.5)

## 2019-06-13 LAB — LIPID PANEL
Chol/HDL Ratio: 3.4 ratio (ref 0.0–5.0)
Cholesterol, Total: 156 mg/dL (ref 100–199)
HDL: 46 mg/dL (ref >39)
LDL Calculated: 37 mg/dL (ref 0–99)
Triglycerides: 365 mg/dL — ABNORMAL HIGH (ref 0–149)
VLDL Cholesterol Cal: 73 mg/dL — ABNORMAL HIGH (ref 5–40)

## 2019-06-13 LAB — BASIC METABOLIC PANEL
BUN/Creatinine Ratio: 19 (ref 10–24)
BUN: 14 mg/dL (ref 8–27)
CO2: 21 mmol/L (ref 20–29)
Calcium: 9 mg/dL (ref 8.6–10.2)
Chloride: 103 mmol/L (ref 96–106)
Creatinine, Ser: 0.75 mg/dL — ABNORMAL LOW (ref 0.76–1.27)
GFR calc Af Amer: 115 mL/min/{1.73_m2} (ref >59)
GFR calc non Af Amer: 100 mL/min/{1.73_m2} (ref >59)
Glucose: 133 mg/dL — ABNORMAL HIGH (ref 65–99)
Potassium: 4.2 mmol/L (ref 3.5–5.2)
Sodium: 138 mmol/L (ref 134–144)

## 2019-08-14 ENCOUNTER — Other Ambulatory Visit: Payer: Self-pay | Admitting: Family Medicine

## 2019-08-14 MED ORDER — GABAPENTIN 100 MG PO CAPS: 100.0000 mg | ORAL_CAPSULE | Freq: Three times a day (TID) | ORAL | 1 refills | Status: DC

## 2019-08-24 ENCOUNTER — Other Ambulatory Visit: Payer: Self-pay | Admitting: Interventional Cardiology

## 2020-01-21 ENCOUNTER — Ambulatory Visit: Payer: Self-pay | Attending: Internal Medicine

## 2020-01-21 DIAGNOSIS — Z23 Encounter for immunization: Secondary | ICD-10-CM

## 2020-01-21 NOTE — Progress Notes (Signed)
   Covid-19 Vaccination Clinic  Name:  Alexander Larson    MRN: [id number] DOB: 1960/12/01  01/21/2020  Mr. Alexander Larson was observed post Covid-19 immunization for 15 minutes without incident. He was provided with Vaccine Information Sheet and instruction to access the V-Safe system.   Mr. Alexander Larson was instructed to call 911 with any severe reactions post vaccine: Marland Kitchen Difficulty breathing  . Swelling of face and throat  . A fast heartbeat  . A bad rash all over body  . Dizziness and weakness   Immunizations Administered    Name Date Dose VIS Date Route   Pfizer COVID-19 Vaccine 01/21/2020  1:45 PM 0.3 mL 10/05/2019 Intramuscular   Manufacturer: [employer], [employer]   Lot: [id number]   NDC: [address]-4

## 2020-02-13 ENCOUNTER — Ambulatory Visit: Payer: Self-pay | Attending: Internal Medicine

## 2020-02-13 DIAGNOSIS — Z23 Encounter for immunization: Secondary | ICD-10-CM

## 2020-02-13 NOTE — Progress Notes (Signed)
   Covid-19 Vaccination Clinic  Name:  Alexander Larson    MRN: [id number] DOB: 18-Jan-1961  02/13/2020  Alexander Larson was observed post Covid-19 immunization for 15 minutes without incident. He was provided with Vaccine Information Sheet and instruction to access the V-Safe system.   Alexander Larson was instructed to call 911 with any severe reactions post vaccine: Marland Kitchen Difficulty breathing  . Swelling of face and throat  . A fast heartbeat  . A bad rash all over body  . Dizziness and weakness   Immunizations Administered    Name Date Dose VIS Date Route   Pfizer COVID-19 Vaccine 02/13/2020  3:11 PM 0.3 mL 12/19/2018 Intramuscular   Manufacturer: [employer], [employer]   Lot: [id number]   NDC: [address]-3

## 2020-04-11 ENCOUNTER — Other Ambulatory Visit: Payer: Self-pay | Admitting: Interventional Cardiology

## 2020-05-06 ENCOUNTER — Other Ambulatory Visit: Payer: Self-pay | Admitting: Interventional Cardiology

## 2020-07-18 DIAGNOSIS — H524 Presbyopia: Secondary | ICD-10-CM | POA: Diagnosis not present

## 2020-09-23 ENCOUNTER — Other Ambulatory Visit: Payer: Self-pay | Admitting: Family Medicine

## 2022-03-16 ENCOUNTER — Ambulatory Visit: Payer: BC Managed Care – PPO | Admitting: Medical

## 2022-03-16 ENCOUNTER — Encounter: Payer: Self-pay | Admitting: Medical

## 2022-03-16 VITALS — BP 150/90 | HR 76 | Ht 66.0 in | Wt 159.0 lb

## 2022-03-16 DIAGNOSIS — F17200 Nicotine dependence, unspecified, uncomplicated: Secondary | ICD-10-CM | POA: Insufficient documentation

## 2022-03-16 DIAGNOSIS — G8929 Other chronic pain: Secondary | ICD-10-CM

## 2022-03-16 DIAGNOSIS — M5417 Radiculopathy, lumbosacral region: Secondary | ICD-10-CM

## 2022-03-16 DIAGNOSIS — R030 Elevated blood-pressure reading, without diagnosis of hypertension: Secondary | ICD-10-CM | POA: Diagnosis not present

## 2022-03-16 DIAGNOSIS — M5440 Lumbago with sciatica, unspecified side: Secondary | ICD-10-CM

## 2022-03-16 DIAGNOSIS — R0989 Other specified symptoms and signs involving the circulatory and respiratory systems: Secondary | ICD-10-CM

## 2022-03-16 MED ORDER — GABAPENTIN 100 MG PO CAPS: 100.0000 mg | ORAL_CAPSULE | Freq: Two times a day (BID) | ORAL | 1 refills | Status: DC

## 2022-03-16 NOTE — Progress Notes (Signed)
Subjective:  Alexander Larson is a 61 y.o. male who presents for Chief Complaint  Patient presents with   Establish Care    New patient to establish care. Has sciatica that runs down to his knees x 4 years. Patient is not fasting today.      Here as a new patient today.  Originally from [place].  [place] in [place]  Was seeing Dr. Jeanice Lim in [facility].    He has concerns about sciatica and back pain since 2020.  He has chronic back pain.  Over the last few months things have gotten worse.  He does get pain down both legs regularly, some tingling in the legs.  No weakness.  No saddle anesthesia.  No incontinence.  No fevers.  No weight loss recently.  He notes that if he walks for period time he gets a lot of pain but when he rests the pain improves  He takes gabapentin 100 mg every morning.  Lately this has not been helping.  It does not make him sleepy.  Otherwise been in usual state of health.  He does get some seasonal allergies.  No other aggravating or relieving factors.    No other c/o.  Past Medical History:  Diagnosis Date   Alcohol abuse 04/05/2017   CAD (coronary artery disease)    04/05/17 PCI DES--RCA, EF 55% // Myoview 10/2018: EF 57, normal perfusion; Low Risk   Constipation 04/15/2017   Hyperlipidemia    Mixed hyperlipidemia    s/p Inferior STEMI 03/2017 tx with DES to RCA 04/05/2017   Tobacco abuse 04/05/2017   Varicose veins of both lower extremities with pain    Current Outpatient Medications on File Prior to Visit  Medication Sig Dispense Refill   aspirin 81 MG chewable tablet Chew 1 tablet (81 mg total) by mouth daily.     gabapentin (NEURONTIN) 100 MG capsule TAKE 1 CAPSULE BY MOUTH AT BEDTIME. MAY INCREASE TO 3 TIMES DAILY IF NEEDED 270 capsule 1   No current facility-administered medications on file prior to visit.     The following portions of the patient's history were reviewed and updated as appropriate: allergies, current  medications, past family history, past medical history, past social history, past surgical history and problem list.  ROS Otherwise as in subjective above  Objective: BP (!) 150/90   Pulse 76   Ht 5\' 6"  (1.676 m)   Wt 159 lb (72.1 kg)   BMI 25.66 kg/m   General appearance: alert, no distress, well developed, well nourished Neck: supple, no lymphadenopathy, no thyromegaly, no masses, no JVD or bruit Heart: RRR, normal S1, S2, no murmurs Lungs: CTA bilaterally, no wheezes, rhonchi, or rales Abdomen: +bs, soft, non tender, non distended, no masses, no hepatomegaly, no splenomegaly Back: Nontender throughout, in the lower thoracic upper lumbar region of the spine there is a step-off bony deformity midline otherwise nontender no obvious scoliosis.  Range of motion about 80% of normal Bilateral hip external range of motion slightly reduced but no pain with range of motion, otherwise legs unremarkable Normal heel and toe walk, 4-5 /5 leg strength bilaterally, DTRs reduced bilaterally however of legs, sensation unremarkable Pulses: 2+ radial pulses, 2+ pedal pulses, normal cap refill Ext: no edema    Assessment: Encounter Diagnoses  Name Primary?   Chronic bilateral low back pain with sciatica, sciatica laterality unspecified Yes   Radicular pain of lumbosacral region    Smoker    Elevated blood-pressure reading without diagnosis  of hypertension    Decreased pedal pulses      Plan: We discussed his symptoms and history of back pain since 2020.  I reviewed prior lumbar x-ray from 2020 showing degenerative changes.  Some of his symptoms suggest possible claudication.  We will get ABI and MRI lumbar spine.  Increase gabapentin to twice daily.  Discussed risk benefits and proper use of medication.  Continue with stretching, we will likely end up referring to orthopedics  I recommend you quit smoking  Looking back in the chart record he has a history of heart attack.  Not currently on  medication for blood pressure or cholesterol.  He notes home blood pressure readings are normal.  Looking in the chart record he has not had blood work in 2 years.  I recommend he come back soon for fasting physical and labs and recheck on blood pressure  Wagner was seen today for establish care.  Diagnoses and all orders for this visit:  Chronic bilateral low back pain with sciatica, sciatica laterality unspecified -     MR Lumbar Spine Wo Contrast; Future  Radicular pain of lumbosacral region -     MR Lumbar Spine Wo Contrast; Future  Smoker -     MR Lumbar Spine Wo Contrast; Future -     VAS [place] ABI WITH/WO TBI; Future  Elevated blood-pressure reading without diagnosis of hypertension -     VAS [place] ABI WITH/WO TBI; Future  Decreased pedal pulses  Other orders -     gabapentin (NEURONTIN) 100 MG capsule; Take 1 capsule (100 mg total) by mouth 2 (two) times daily.    Follow up: pending studies

## 2022-03-16 NOTE — Patient Instructions (Signed)
Since you have worsening back pain and radicular pain, I would like to set you up for an MRI of your spine and a blood flow study in the legs since you are a smoker.  Smokers with low back pain and leg pain that is relieved with rest can have something called claudication.  I recommend you quit smoking  I recommend you change your gabapentin to twice daily dosing to see if this helps your symptoms.  The gabapentin can go up in dose as needed over time but will start with twice daily dosing.  I will likely end up referring you to orthopedics if your MRI is abnormal   I would like to see you back in the next several weeks or months or so for a fasting physical.  I would like to recheck on your blood pressure and get an updated cholesterol reading.  You may need to be back on medication for cholesterol and possibly for blood pressure as well      Poniewa? masz nasilaj?cy si? bl plecw i bl korzeniowy, chcia?bym umwi? Ci? na rezonans magnetyczny kr?gos?upa i badanie przep?ywu krwi w nogach, poniewa? jeste? palaczem.  Palacze z blem krzy?a i ng, ktry ust?puje po odpoczynku, mog? mie? co?, co nazywa si? chromaniem.  Radz? rzuci? palenie  Zalecam zmian? dawki gabapentyny na dwa razy dziennie, aby sprawdzi?, czy to pomo?e w twoich objawach. Dawk? gabapentyny mo?na zwi?ksza? w razie potrzeby w miar? up?ywu czasu, ale zacznie si? od dawkowania dwa razy dziennie.  Prawdopodobnie sko?cz? odsy?aj?c ci? do ortopedy, je?li twj MRI jest nieprawid?owy   Chcia?bym zobaczy? ci? z powrotem w ci?gu najbli?szych kilku tygodni lub miesi?cy na czczo na badaniu fizycznym. Chcia?bym ponownie sprawdzi? ci?nienie krwi i uzyska? Financial risk analyst. By? mo?e b?dziesz musia? wrci? do lekw na cholesterol i by? mo?e rwnie? na ci?nienie krwi

## 2022-03-29 ENCOUNTER — Telehealth: Payer: Self-pay | Admitting: Internal Medicine

## 2022-03-29 NOTE — Telephone Encounter (Signed)
Darl Pikes from [facility] called and left a message on my phone Friday that patient is scheduled for ABI ultrasound on Tuesday 03/30/22 and pt needs prior Auth done for [employer]. Can you please call to get this done please. You can call susan back with auth today at [phone]

## 2022-03-30 ENCOUNTER — Ambulatory Visit (HOSPITAL_COMMUNITY)
Admission: RE | Admit: 2022-03-30 | Discharge: 2022-03-30 | Disposition: A | Discharge status: Home / Self Care | Payer: BC Managed Care – PPO | Source: Ambulatory Visit | Attending: Medical | Admitting: Medical

## 2022-03-30 DIAGNOSIS — R030 Elevated blood-pressure reading, without diagnosis of hypertension: Secondary | ICD-10-CM | POA: Insufficient documentation

## 2022-03-30 DIAGNOSIS — F17200 Nicotine dependence, unspecified, uncomplicated: Secondary | ICD-10-CM | POA: Diagnosis not present

## 2022-04-02 ENCOUNTER — Ambulatory Visit
Admission: RE | Admit: 2022-04-02 | Discharge: 2022-04-02 | Disposition: A | Discharge status: Home / Self Care | Payer: BC Managed Care – PPO | Source: Ambulatory Visit | Attending: Medical | Admitting: Medical

## 2022-04-02 DIAGNOSIS — G8929 Other chronic pain: Secondary | ICD-10-CM

## 2022-04-02 DIAGNOSIS — M48061 Spinal stenosis, lumbar region without neurogenic claudication: Secondary | ICD-10-CM | POA: Diagnosis not present

## 2022-04-02 DIAGNOSIS — M545 Low back pain, unspecified: Secondary | ICD-10-CM | POA: Diagnosis not present

## 2022-04-02 DIAGNOSIS — M5417 Radiculopathy, lumbosacral region: Secondary | ICD-10-CM

## 2022-04-02 DIAGNOSIS — F17200 Nicotine dependence, unspecified, uncomplicated: Secondary | ICD-10-CM

## 2022-04-02 DIAGNOSIS — M5416 Radiculopathy, lumbar region: Secondary | ICD-10-CM | POA: Diagnosis not present

## 2022-04-05 ENCOUNTER — Other Ambulatory Visit: Payer: Self-pay | Admitting: Medical

## 2022-04-05 DIAGNOSIS — G8929 Other chronic pain: Secondary | ICD-10-CM

## 2022-04-05 DIAGNOSIS — M5417 Radiculopathy, lumbosacral region: Secondary | ICD-10-CM

## 2022-04-12 DIAGNOSIS — M5451 Vertebrogenic low back pain: Secondary | ICD-10-CM | POA: Diagnosis not present

## 2022-04-12 DIAGNOSIS — M5416 Radiculopathy, lumbar region: Secondary | ICD-10-CM | POA: Diagnosis not present

## 2022-05-07 ENCOUNTER — Encounter: Payer: Self-pay | Admitting: Medical

## 2022-05-07 ENCOUNTER — Ambulatory Visit (INDEPENDENT_AMBULATORY_CARE_PROVIDER_SITE_OTHER): Payer: BC Managed Care – PPO | Admitting: Medical

## 2022-05-07 VITALS — BP 120/70 | HR 72 | Wt 158.6 lb

## 2022-05-07 DIAGNOSIS — I83893 Varicose veins of bilateral lower extremities with other complications: Secondary | ICD-10-CM | POA: Diagnosis not present

## 2022-05-07 DIAGNOSIS — E782 Mixed hyperlipidemia: Secondary | ICD-10-CM | POA: Diagnosis not present

## 2022-05-07 DIAGNOSIS — I2510 Atherosclerotic heart disease of native coronary artery without angina pectoris: Secondary | ICD-10-CM

## 2022-05-07 DIAGNOSIS — Z7185 Encounter for immunization safety counseling: Secondary | ICD-10-CM | POA: Diagnosis not present

## 2022-05-07 DIAGNOSIS — N521 Erectile dysfunction due to diseases classified elsewhere: Secondary | ICD-10-CM

## 2022-05-07 DIAGNOSIS — Z0000 Encounter for general adult medical examination without abnormal findings: Secondary | ICD-10-CM

## 2022-05-07 DIAGNOSIS — Z23 Encounter for immunization: Secondary | ICD-10-CM

## 2022-05-07 DIAGNOSIS — I739 Peripheral vascular disease, unspecified: Secondary | ICD-10-CM

## 2022-05-07 DIAGNOSIS — Z720 Tobacco use: Secondary | ICD-10-CM

## 2022-05-07 DIAGNOSIS — K4090 Unilateral inguinal hernia, without obstruction or gangrene, not specified as recurrent: Secondary | ICD-10-CM

## 2022-05-07 DIAGNOSIS — I252 Old myocardial infarction: Secondary | ICD-10-CM

## 2022-05-07 DIAGNOSIS — Z131 Encounter for screening for diabetes mellitus: Secondary | ICD-10-CM | POA: Insufficient documentation

## 2022-05-07 DIAGNOSIS — Z125 Encounter for screening for malignant neoplasm of prostate: Secondary | ICD-10-CM | POA: Diagnosis not present

## 2022-05-07 DIAGNOSIS — Z955 Presence of coronary angioplasty implant and graft: Secondary | ICD-10-CM

## 2022-05-07 DIAGNOSIS — K5900 Constipation, unspecified: Secondary | ICD-10-CM

## 2022-05-07 DIAGNOSIS — G8929 Other chronic pain: Secondary | ICD-10-CM

## 2022-05-07 DIAGNOSIS — M5440 Lumbago with sciatica, unspecified side: Secondary | ICD-10-CM

## 2022-05-07 LAB — POCT URINALYSIS DIP (PROADVANTAGE DEVICE)
Bilirubin, UA: NEGATIVE
Blood, UA: NEGATIVE
Glucose, UA: NEGATIVE mg/dL
Leukocytes, UA: NEGATIVE
Nitrite, UA: NEGATIVE
Specific Gravity, Urine: 1.025
Urobilinogen, Ur: 0.2
pH, UA: 6 (ref 5.0–8.0)

## 2022-05-07 MED ORDER — ROSUVASTATIN CALCIUM 20 MG PO TABS: 20.0000 mg | ORAL_TABLET | Freq: Every day | ORAL | 3 refills | Status: DC

## 2022-05-07 NOTE — Patient Instructions (Signed)
This visit was a preventative care visit, also known as wellness visit or routine physical.   Topics typically include healthy lifestyle, diet, exercise, preventative care, vaccinations, sick and well care, proper use of emergency dept and after hours care, as well as other concerns.     Recommendations: Continue to return yearly for your annual wellness and preventative care visits.  This gives [place] a chance to discuss healthy lifestyle, exercise, vaccinations, review your chart record, and perform screenings where appropriate.  I recommend you see your eye doctor yearly for routine vision care.  I recommend you see your dentist yearly for routine dental care including hygiene visits twice yearly.   Vaccination recommendations were reviewed Immunization History  Administered Date(s) Administered   PFIZER(Purple Top)SARS-COV-2 Vaccination 01/21/2020, 02/13/2020   Tdap 08/22/2017    I recommend a yearly flu shot in the fall  Shingles vaccine:  I recommend you have a shingles vaccine to help prevent shingles or herpes zoster outbreak.   Please call your insurer to inquire about coverage for the Shingrix vaccine given in 2 doses.   Some insurers cover this vaccine after age 69, some cover this after age 35.  If your insurer covers this, then call to schedule appointment to have this vaccine here.  Counseled on the pneumococcal vaccine.  Vaccine information sheet given.  Pneumococcal vaccine Prevnar 20 given after consent obtained.   Screening for cancer: Colon cancer screening: Consider Cologard stool test or Colonoscopy.   The goal is to screen for cancer and catch it early.  Let me know if you want to pursue one of these tests.  You were sure about this today.  We are checking as PSA prostate cancer screen lab today   Skin cancer screening: Check your skin regularly for new changes, growing lesions, or other lesions of concern Come in for evaluation if you have skin lesions of  concern.  Lung cancer screening: It is recommended to have a CT chest scan to screen for lung cancer.  This would be a yearly screen.   Let me know if you want to pursue this.  We currently don't have screenings for other cancers besides breast, cervical, colon, and lung cancers.  If you have a strong family history of cancer or have other cancer screening concerns, please let me know.    Bone health: Get at least 150 minutes of aerobic exercise weekly Get weight bearing exercise at least once weekly Bone density test:  A bone density test is an imaging test that uses a type of X-ray to measure the amount of calcium and other minerals in your bones. The test may be used to diagnose or screen you for a condition that causes weak or thin bones (osteoporosis), predict your risk for a broken bone (fracture), or determine how well your osteoporosis treatment is working. The bone density test is recommended for females 65 and older, or females or males <65 if certain risk factors such as thyroid disease, long term use of steroids such as for asthma or rheumatological issues, vitamin D deficiency, estrogen deficiency, family history of osteoporosis, self or family history of fragility fracture in first degree relative.    Heart health: Get at least 150 minutes of aerobic exercise weekly Limit alcohol It is important to maintain a healthy blood pressure and healthy cholesterol numbers  Heart disease screening: Screening for heart disease includes screening for blood pressure, fasting lipids, glucose/diabetes screening, BMI height to weight ratio, reviewed of smoking status, physical activity,  and diet.    Goals include blood pressure 120/80 or less, maintaining a healthy lipid/cholesterol profile, preventing diabetes or keeping diabetes numbers under good control, not smoking or using tobacco products, exercising most days per week or at least 150 minutes per week of exercise, and eating healthy  variety of fruits and vegetables, healthy oils, and avoiding unhealthy food choices like fried food, fast food, high sugar and high cholesterol foods.    Continue routine follow up with your heart doctor.  You have a history of heart attack.  You should be taking cholesterol medicine and aspirin daily.  You report that you are taking aspirin already.  Please add back Crestor daily at bedtime to help lower your risk of future blockages and cholesterol buildup in the arteries.    Medical care options: I recommend you continue to seek care here first for routine care.  We try really hard to have available appointments Monday through Friday daytime hours for sick visits, acute visits, and physicals.  Urgent care should be used for after hours and weekends for significant issues that cannot wait till the next day.  The emergency department should be used for significant potentially life-threatening emergencies.  The emergency department is expensive, can often have long wait times for less significant concerns, so try to utilize primary care, urgent care, or telemedicine when possible to avoid unnecessary trips to the emergency department.  Virtual visits and telemedicine have been introduced since the pandemic started in 2020, and can be convenient ways to receive medical care.  We offer virtual appointments as well to assist you in a variety of options to seek medical care.   Advanced Directives: I recommend you consider completing a [place] and Living Will.   These documents respect your wishes and help alleviate burdens on your loved ones if you were to become terminally ill or be in a position to need those documents enforced.    You can complete Advanced Directives yourself, have them notarized, then have copies made for our office, for you and for anybody you feel should have them in safe keeping.  Or, you can have an attorney prepare these documents.   If you haven't updated  your Last Will and Testament in a while, it may be worthwhile having an attorney prepare these documents together and save on some costs.       Separate significant issues discussed: Varicose veins I recommend walking for exercise regularly Continue aspirin daily Consider wearing compression hose daily chest  Tobacco abuse I recommend to quit smoking  You have seen vascular doctor before for peripheral arterial disease in the legs If you start getting pains in your legs or pain when walking then recheck with [place] right away Continue aspirin daily and begin Crestor cholesterol medication daily at bedtime  Erectile dysfunction Begin trial of Cialis.  You reported that you tried Viagra before without a lot of improvement.  Lets see if you do better with Cialis Take 1 tablet of 20 mg Cialis approximately 30 minutes before sexual activity Do not take any more than 1 tablet in a 24-hour period Do not take more than 1 tablet at a time If this does not seem to help over the next couple weeks let me know If this is too expensive or not covered by insurance let me know You could also consider a vacuum erection device which is a non medication way to help get erections  Left inguinal hernia You have  a small hernia noted on exam today.  This apparently is not causing any problems We will continue to monitor this, however if you start having problems in the left inguinal region let me know

## 2022-05-07 NOTE — Progress Notes (Signed)
Subjective:   HPI  Alexander Larson is a 61 y.o. male who presents for Chief Complaint  Patient presents with   Annual Exam    Fasting Cpe.    Patient Care Team: Nakkia Mackiewicz, Cleda Mccreedy as PCP - General (Family Medicine) Lyn Records, MD as PCP - Cardiology (Cardiology) Sees [facility] Dr. Regino Schultze, [facility] Dr. Josephina Gip, vascular surgery  Concerns: He just saw orthopedics recently about his back.  He may end up needing a handicap parking placard  He continues to smoke  He notes problems getting erections for the last 3 years.  At one point he tried Viagra but it did not work too well.  He would like to try something else.  No chest pain, shortness of breath, edema, or difficulty with exercise  Very active on the job walking throughout the day   Reviewed their medical, surgical, family, social, medication, and allergy history and updated chart as appropriate.  Past Medical History:  Diagnosis Date   Alcohol abuse 04/05/2017   CAD (coronary artery disease)    04/05/17 PCI DES--RCA, EF 55% // Myoview 10/2018: EF 57, normal perfusion; Low Risk   Constipation 04/15/2017   Hyperlipidemia    Mixed hyperlipidemia    s/p Inferior STEMI 03/2017 tx with DES to RCA 04/05/2017   Tobacco abuse 04/05/2017   Varicose veins of both lower extremities with pain     Past Surgical History:  Procedure Laterality Date   CORONARY STENT INTERVENTION N/A 04/05/2017   Procedure: Coronary Stent Intervention;  Surgeon: Lyn Records, MD;  Location: [facility] INVASIVE CV LAB;  Service: Cardiovascular;  Laterality: N/A;   LEFT HEART CATH AND CORONARY ANGIOGRAPHY N/A 04/05/2017   Procedure: Left Heart Cath and Coronary Angiography;  Surgeon: Lyn Records, MD;  Location: [facility] INVASIVE CV LAB;  Service: Cardiovascular;  Laterality: N/A;    Family History  Problem Relation Age of Onset   Hypertension Father    Cancer Father        lung     Current Outpatient Medications:     aspirin 81 MG chewable tablet, Chew 1 tablet (81 mg total) by mouth daily., Disp: , Rfl:    gabapentin (NEURONTIN) 100 MG capsule, TAKE 1 CAPSULE BY MOUTH AT BEDTIME. MAY INCREASE TO 3 TIMES DAILY IF NEEDED, Disp: 270 capsule, Rfl: 1   gabapentin (NEURONTIN) 100 MG capsule, Take 1 capsule (100 mg total) by mouth 2 (two) times daily., Disp: 60 capsule, Rfl: 1   rosuvastatin (CRESTOR) 20 MG tablet, Take 1 tablet (20 mg total) by mouth daily., Disp: 90 tablet, Rfl: 3  Allergies  Allergen Reactions   Carvedilol Rash     Review of Systems Constitutional: -fever, -chills, -sweats, -unexpected weight change, -decreased appetite, -fatigue Allergy: -sneezing, -itching, -congestion Dermatology: -changing moles, --rash, -lumps ENT: -runny nose, -ear pain, -sore throat, -hoarseness, -sinus pain, -teeth pain, - ringing in ears, -hearing loss, -nosebleeds Cardiology: -chest pain, -palpitations, -swelling, -difficulty breathing when lying flat, -waking up short of breath Respiratory: -cough, -shortness of breath, -difficulty breathing with exercise or exertion, -wheezing, -coughing up blood Gastroenterology: -abdominal pain, -nausea, -vomiting, -diarrhea, -constipation, -blood in stool, -changes in bowel movement, -difficulty swallowing or eating Hematology: -bleeding, -bruising  Musculoskeletal: -joint aches, -muscle aches, -joint swelling, -back pain, -neck pain, -cramping, -changes in gait Ophthalmology: denies vision changes, eye redness, itching, discharge Urology: -burning with urination, -difficulty urinating, -blood in urine, -urinary frequency, -urgency, -incontinence Neurology: -headache, -weakness, -tingling, -numbness, -memory loss, -  falls, -dizziness Psychology: -depressed mood, -agitation, -sleep problems Male GU: no testicular mass, pain, no lymph nodes swollen, no swelling, no rash.     05/07/2022    3:06 PM 03/16/2022   11:22 AM 08/22/2017    3:02 PM  Depression screen PHQ 2/9   Decreased Interest 0 0 0  Down, Depressed, Hopeless 0 0 0  PHQ - 2 Score 0 0 0  Altered sleeping   0  Tired, decreased energy   0  Change in appetite   0  Feeling bad or failure about yourself    0  Trouble concentrating   0  Moving slowly or fidgety/restless   0  Suicidal thoughts   0  PHQ-9 Score   0  Difficult doing work/chores   Not difficult at all        Objective:  BP 120/70   Pulse 72   Wt 158 lb 9.6 oz (71.9 kg)   SpO2 100%   BMI 25.60 kg/m   General appearance: alert, no distress, WD/WN, Caucasian male Skin: scattered macules , no worrisome lesions HEENT: normocephalic, conjunctiva/corneas normal, sclerae anicteric, PERRLA, EOMi, nares patent, no discharge or erythema, pharynx normal Oral cavity: MMM, tongue normal, teeth in good repair Neck: supple, no lymphadenopathy, no thyromegaly, no masses, normal ROM, no bruits Chest: non tender, normal shape and expansion Heart: RRR, normal S1, S2, no murmurs Lungs: CTA bilaterally, no wheezes, rhonchi, or rales Abdomen: +bs, soft, non tender, non distended, no masses, no hepatomegaly, no splenomegaly, no bruits Back: non tender, normal ROM, no scoliosis Musculoskeletal: upper extremities non tender, no obvious deformity, normal ROM throughout, lower extremities non tender, no obvious deformity, normal ROM throughout Extremities: quite moderate tortuous bilat LE varicose veins, no edema, no cyanosis, no clubbing Pulses: 2+ symmetric, upper and lower extremities, normal cap refill Neurological: alert, oriented x 3, CN2-12 intact, strength normal upper extremities and lower extremities, sensation normal throughout, DTRs 2+ throughout, no cerebellar signs, gait normal Psychiatric: normal affect, behavior normal, pleasant  GU: normal male external genitalia,uncircumcised, nontender, no masses, +left inguinal hernia, small, reducible, no lymphadenopathy Rectal: deferred/declined   Assessment and Plan :   Encounter Diagnoses   Name Primary?   Encounter for health maintenance examination in adult Yes   Need for pneumococcal 20-valent conjugate vaccination    Vaccine counseling    Varicose veins of bilateral lower extremities with other complications    Tobacco abuse    Status post coronary artery stent placement    s/p Inferior STEMI 03/2017 tx with DES to RCA    PAD (peripheral artery disease) (HCC)    Mixed hyperlipidemia    Erectile dysfunction due to diseases classified elsewhere    Coronary artery disease involving native coronary artery of native heart without angina pectoris    Chronic bilateral low back pain with sciatica, sciatica laterality unspecified    Constipation, unspecified constipation type    Left inguinal hernia    Screening for diabetes mellitus    Screening for prostate cancer     This visit was a preventative care visit, also known as wellness visit or routine physical.   Topics typically include healthy lifestyle, diet, exercise, preventative care, vaccinations, sick and well care, proper use of emergency dept and after hours care, as well as other concerns.     Recommendations: Continue to return yearly for your annual wellness and preventative care visits.  This gives [place] a chance to discuss healthy lifestyle, exercise, vaccinations, review your chart record, and perform  screenings where appropriate.  I recommend you see your eye doctor yearly for routine vision care.  I recommend you see your dentist yearly for routine dental care including hygiene visits twice yearly.   Vaccination recommendations were reviewed Immunization History  Administered Date(s) Administered   PFIZER(Purple Top)SARS-COV-2 Vaccination 01/21/2020, 02/13/2020   PNEUMOCOCCAL CONJUGATE-20 05/07/2022   Tdap 08/22/2017    I recommend a yearly flu shot in the fall  Shingles vaccine:  I recommend you have a shingles vaccine to help prevent shingles or herpes zoster outbreak.   Please call your insurer to  inquire about coverage for the Shingrix vaccine given in 2 doses.   Some insurers cover this vaccine after age 10950, some cover this after age 63.  If your insurer covers this, then call to schedule appointment to have this vaccine here.  Counseled on the pneumococcal vaccine.  Vaccine information sheet given.  Pneumococcal vaccine Prevnar 20 given after consent obtained.   Screening for cancer: Colon cancer screening: Consider Cologard stool test or Colonoscopy.   The goal is to screen for cancer and catch it early.  Let me know if you want to pursue one of these tests.  You were sure about this today.  We are checking as PSA prostate cancer screen lab today   Skin cancer screening: Check your skin regularly for new changes, growing lesions, or other lesions of concern Come in for evaluation if you have skin lesions of concern.  Lung cancer screening: It is recommended to have a CT chest scan to screen for lung cancer.  This would be a yearly screen.   Let me know if you want to pursue this.  We currently don't have screenings for other cancers besides breast, cervical, colon, and lung cancers.  If you have a strong family history of cancer or have other cancer screening concerns, please let me know.    Bone health: Get at least 150 minutes of aerobic exercise weekly Get weight bearing exercise at least once weekly Bone density test:  A bone density test is an imaging test that uses a type of X-ray to measure the amount of calcium and other minerals in your bones. The test may be used to diagnose or screen you for a condition that causes weak or thin bones (osteoporosis), predict your risk for a broken bone (fracture), or determine how well your osteoporosis treatment is working. The bone density test is recommended for females 65 and older, or females or males <65 if certain risk factors such as thyroid disease, long term use of steroids such as for asthma or rheumatological issues,  vitamin D deficiency, estrogen deficiency, family history of osteoporosis, self or family history of fragility fracture in first degree relative.    Heart health: Get at least 150 minutes of aerobic exercise weekly Limit alcohol It is important to maintain a healthy blood pressure and healthy cholesterol numbers  Heart disease screening: Screening for heart disease includes screening for blood pressure, fasting lipids, glucose/diabetes screening, BMI height to weight ratio, reviewed of smoking status, physical activity, and diet.    Goals include blood pressure 120/80 or less, maintaining a healthy lipid/cholesterol profile, preventing diabetes or keeping diabetes numbers under good control, not smoking or using tobacco products, exercising most days per week or at least 150 minutes per week of exercise, and eating healthy variety of fruits and vegetables, healthy oils, and avoiding unhealthy food choices like fried food, fast food, high sugar and high cholesterol foods.  Continue routine follow up with your heart doctor.  You have a history of heart attack.  You should be taking cholesterol medicine and aspirin daily.  You report that you are taking aspirin already.  Please add back Crestor daily at bedtime to help lower your risk of future blockages and cholesterol buildup in the arteries.    Medical care options: I recommend you continue to seek care here first for routine care.  We try really hard to have available appointments Monday through Friday daytime hours for sick visits, acute visits, and physicals.  Urgent care should be used for after hours and weekends for significant issues that cannot wait till the next day.  The emergency department should be used for significant potentially life-threatening emergencies.  The emergency department is expensive, can often have long wait times for less significant concerns, so try to utilize primary care, urgent care, or telemedicine when  possible to avoid unnecessary trips to the emergency department.  Virtual visits and telemedicine have been introduced since the pandemic started in 2020, and can be convenient ways to receive medical care.  We offer virtual appointments as well to assist you in a variety of options to seek medical care.   Advanced Directives: I recommend you consider completing a Health Care Power of Attorney and Living Will.   These documents respect your wishes and help alleviate burdens on your loved ones if you were to become terminally ill or be in a position to need those documents enforced.    You can complete Advanced Directives yourself, have them notarized, then have copies made for our office, for you and for anybody you feel should have them in safe keeping.  Or, you can have an attorney prepare these documents.   If you haven't updated your Last Will and Testament in a while, it may be worthwhile having an attorney prepare these documents together and save on some costs.       Separate significant issues discussed: Varicose veins I recommend walking for exercise regularly Continue aspirin daily Consider wearing compression hose daily chest  Tobacco abuse I recommend to quit smoking  You have seen vascular doctor before for peripheral arterial disease in the legs If you start getting pains in your legs or pain when walking then recheck with [place] right away Continue aspirin daily and begin Crestor cholesterol medication daily at bedtime  Erectile dysfunction Begin trial of Cialis.  You reported that you tried Viagra before without a lot of improvement.  Lets see if you do better with Cialis Take 1 tablet of 20 mg Cialis approximately 30 minutes before sexual activity Do not take any more than 1 tablet in a 24-hour period Do not take more than 1 tablet at a time If this does not seem to help over the next couple weeks let me know If this is too expensive or not covered by insurance let me  know You could also consider a vacuum erection device which is a non medication way to help get erections  Left inguinal hernia You have a small hernia noted on exam today.  This apparently is not causing any problems We will continue to monitor this, however if you start having problems in the left inguinal region let me know      Alexander Larson was seen today for annual exam.  Diagnoses and all orders for this visit:  Encounter for health maintenance examination in adult -     Comprehensive metabolic panel -  CBC -     Lipid panel -     PSA -     Hemoglobin A1c -     POCT Urinalysis DIP (Proadvantage Device)  Need for pneumococcal 20-valent conjugate vaccination -     Pneumococcal conjugate vaccine 20-valent  Vaccine counseling  Varicose veins of bilateral lower extremities with other complications  Tobacco abuse  Status post coronary artery stent placement  s/p Inferior STEMI 03/2017 tx with DES to RCA  PAD (peripheral artery disease) (HCC) -     Lipid panel  Mixed hyperlipidemia -     Lipid panel  Erectile dysfunction due to diseases classified elsewhere  Coronary artery disease involving native coronary artery of native heart without angina pectoris  Chronic bilateral low back pain with sciatica, sciatica laterality unspecified  Constipation, unspecified constipation type  Left inguinal hernia  Screening for diabetes mellitus -     Hemoglobin A1c  Screening for prostate cancer -     PSA  Other orders -     rosuvastatin (CRESTOR) 20 MG tablet; Take 1 tablet (20 mg total) by mouth daily.    Follow-up pending labs, yearly for physical

## 2022-05-08 LAB — COMPREHENSIVE METABOLIC PANEL
ALT: 49 IU/L — ABNORMAL HIGH (ref 0–44)
AST: 51 IU/L — ABNORMAL HIGH (ref 0–40)
Albumin/Globulin Ratio: 1.6 (ref 1.2–2.2)
Albumin: 4.6 g/dL (ref 3.9–4.9)
Alkaline Phosphatase: 97 IU/L (ref 44–121)
BUN/Creatinine Ratio: 28 — ABNORMAL HIGH (ref 10–24)
BUN: 18 mg/dL (ref 8–27)
Bilirubin Total: 0.7 mg/dL (ref 0.0–1.2)
CO2: 20 mmol/L (ref 20–29)
Calcium: 9.5 mg/dL (ref 8.6–10.2)
Chloride: 101 mmol/L (ref 96–106)
Creatinine, Ser: 0.64 mg/dL — ABNORMAL LOW (ref 0.76–1.27)
Globulin, Total: 2.8 g/dL (ref 1.5–4.5)
Glucose: 85 mg/dL (ref 70–99)
Potassium: 4.2 mmol/L (ref 3.5–5.2)
Sodium: 136 mmol/L (ref 134–144)
Total Protein: 7.4 g/dL (ref 6.0–8.5)
eGFR: 106 mL/min/{1.73_m2} (ref >59)

## 2022-05-08 LAB — CBC
Hematocrit: 45.4 % (ref 37.5–51.0)
Hemoglobin: 16.1 g/dL (ref 13.0–17.7)
MCH: 33.8 pg — ABNORMAL HIGH (ref 26.6–33.0)
MCHC: 35.5 g/dL (ref 31.5–35.7)
MCV: 95 fL (ref 79–97)
Platelets: 167 10*3/uL (ref 150–450)
RBC: 4.77 x10E6/uL (ref 4.14–5.80)
RDW: 12.7 % (ref 11.6–15.4)
WBC: 7.9 10*3/uL (ref 3.4–10.8)

## 2022-05-08 LAB — LIPID PANEL
Chol/HDL Ratio: 5 ratio (ref 0.0–5.0)
Cholesterol, Total: 240 mg/dL — ABNORMAL HIGH (ref 100–199)
HDL: 48 mg/dL (ref >39)
LDL Chol Calc (NIH): 153 mg/dL — ABNORMAL HIGH (ref 0–99)
Triglycerides: 215 mg/dL — ABNORMAL HIGH (ref 0–149)
VLDL Cholesterol Cal: 39 mg/dL (ref 5–40)

## 2022-05-08 LAB — PSA: Prostate Specific Ag, Serum: 0.8 ng/mL (ref 0.0–4.0)

## 2022-05-08 LAB — HEMOGLOBIN A1C
Est. average glucose Bld gHb Est-mCnc: 117 mg/dL
Hgb A1c MFr Bld: 5.7 % — ABNORMAL HIGH (ref 4.8–5.6)

## 2022-05-09 ENCOUNTER — Other Ambulatory Visit: Payer: Self-pay | Admitting: Medical

## 2022-05-09 MED ORDER — TADALAFIL 20 MG PO TABS: 20.0000 mg | ORAL_TABLET | Freq: Every day | ORAL | 1 refills | Status: DC | PRN

## 2022-05-09 MED ORDER — ASPIRIN 81 MG PO CHEW: 81.0000 mg | CHEWABLE_TABLET | Freq: Every day | ORAL | 3 refills | Status: DC

## 2022-05-25 DIAGNOSIS — M5416 Radiculopathy, lumbar region: Secondary | ICD-10-CM | POA: Diagnosis not present

## 2022-06-18 DIAGNOSIS — M5416 Radiculopathy, lumbar region: Secondary | ICD-10-CM | POA: Diagnosis not present

## 2022-07-02 ENCOUNTER — Ambulatory Visit: Payer: BC Managed Care – PPO | Admitting: Medical

## 2022-07-02 VITALS — BP 130/82 | HR 56 | Wt 155.8 lb

## 2022-07-02 DIAGNOSIS — E785 Hyperlipidemia, unspecified: Secondary | ICD-10-CM

## 2022-07-02 DIAGNOSIS — Z1211 Encounter for screening for malignant neoplasm of colon: Secondary | ICD-10-CM

## 2022-07-02 DIAGNOSIS — M5440 Lumbago with sciatica, unspecified side: Secondary | ICD-10-CM

## 2022-07-02 DIAGNOSIS — R300 Dysuria: Secondary | ICD-10-CM

## 2022-07-02 DIAGNOSIS — E782 Mixed hyperlipidemia: Secondary | ICD-10-CM

## 2022-07-02 DIAGNOSIS — M170 Bilateral primary osteoarthritis of knee: Secondary | ICD-10-CM

## 2022-07-02 DIAGNOSIS — R7989 Other specified abnormal findings of blood chemistry: Secondary | ICD-10-CM

## 2022-07-02 DIAGNOSIS — N521 Erectile dysfunction due to diseases classified elsewhere: Secondary | ICD-10-CM

## 2022-07-02 DIAGNOSIS — G8929 Other chronic pain: Secondary | ICD-10-CM

## 2022-07-02 NOTE — Progress Notes (Signed)
Subjective:  Alexander Larson is a 61 y.o. male who presents for Chief Complaint  Patient presents with   recheck on labs    Recheck on labs. Needs handicap placard. Declines flu shot     Here for med check.  I saw him in July for a physical.  At that time given his lipids we started Crestor.  He is taking Crestor without complaint  At his visit we started Cialis to help with erectile dysfunction.  He has not had a chance to use this.  He would like a handicap placard filled out.  He sees orthopedics and had been getting temporary cause but needs a more longer-term card as he still has problems with chronic back pain and limitations with arthritis of the knees  He is under some stress right now.  His wife is here today being seen for abdominal pain, this 109 year old son is in the hospital right now with pancreatitis.  So he has several stressors  He notes a few weeks ago he had some burning in urinary frequency that lasted few days and cleared up.  No prior concerns.  No history of UTI.  His wife did not have any urinary symptoms at that time either.  No other aggravating or relieving factors.    No other c/o.  Past Medical History:  Diagnosis Date   Alcohol abuse 04/05/2017   CAD (coronary artery disease)    04/05/17 PCI DES--RCA, EF 55% // Myoview 10/2018: EF 57, normal perfusion; Low Risk   Constipation 04/15/2017   Hyperlipidemia    Mixed hyperlipidemia    s/p Inferior STEMI 03/2017 tx with DES to RCA 04/05/2017   Tobacco abuse 04/05/2017   Varicose veins of both lower extremities with pain    Current Outpatient Medications on File Prior to Visit  Medication Sig Dispense Refill   aspirin 81 MG chewable tablet Chew 1 tablet (81 mg total) by mouth daily. 90 tablet 3   gabapentin (NEURONTIN) 100 MG capsule TAKE 1 CAPSULE BY MOUTH AT BEDTIME. MAY INCREASE TO 3 TIMES DAILY IF NEEDED 270 capsule 1   rosuvastatin (CRESTOR) 20 MG tablet Take 1 tablet (20 mg total) by mouth daily. 90 tablet  3   tadalafil (CIALIS) 20 MG tablet Take 1 tablet (20 mg total) by mouth daily as needed for erectile dysfunction. 10 tablet 1   No current facility-administered medications on file prior to visit.    The following portions of the patient's history were reviewed and updated as appropriate: allergies, current medications, past family history, past medical history, past social history, past surgical history and problem list.  ROS Otherwise as in subjective above  Objective: BP 130/82   Pulse (!) 56   Wt 155 lb 12.8 oz (70.7 kg)   BMI 25.15 kg/m   General appearance: alert, no distress, well developed, well nourished neck: supple, no lymphadenopathy, no thyromegaly, no masses Heart: RRR, normal S1, S2, no murmurs Lungs: CTA bilaterally, no wheezes, rhonchi, or rales Abdomen: +bs, soft, non tender, non distended, no masses, no hepatomegaly, no splenomegaly Pulses: 2+ radial pulses, 2+ pedal pulses, normal cap refill Ext: no edema   Assessment: Encounter Diagnoses  Name Primary?   Elevated LFTs Yes   Hyperlipidemia, unspecified hyperlipidemia type    Mixed hyperlipidemia    Chronic bilateral low back pain with sciatica, sciatica laterality unspecified    Erectile dysfunction due to diseases classified elsewhere    Screen for colon cancer    Dysuria    Osteoarthritis  of both knees, unspecified osteoarthritis type      Plan: Elevated liver test-I suspect this is due to alcohol use chronic and possibly fatty liver disease.  Additional labs today, consider ultrasound.  We discussed limiting alcohol.  He is just drinking beer currently not hard liquor.  We discussed limiting alcohol in general  High cholesterol-he notes compliance with statin, updated labs today  I will work on completing his handicap placard application given his chronic low back pain and arthritis to the knees  Screening for colon cancer-referral placed  He had some recent dysuria but that cleared up  thankfully.  [name] was seen today for recheck on labs.  Diagnoses and all orders for this visit:  Elevated LFTs -     Hepatic function panel -     Hepatitis C antibody -     Hepatitis B surface antigen -     Iron  Hyperlipidemia, unspecified hyperlipidemia type -     Lipid panel  Mixed hyperlipidemia  Chronic bilateral low back pain with sciatica, sciatica laterality unspecified  Erectile dysfunction due to diseases classified elsewhere  Screen for colon cancer -     Ambulatory referral to Gastroenterology  Dysuria  Osteoarthritis of both knees, unspecified osteoarthritis type    Follow up: pending labs

## 2022-07-03 LAB — HEPATITIS B SURFACE ANTIGEN: Hepatitis B Surface Ag: NEGATIVE

## 2022-07-03 LAB — LIPID PANEL
Chol/HDL Ratio: 5.4 ratio — ABNORMAL HIGH (ref 0.0–5.0)
Cholesterol, Total: 201 mg/dL — ABNORMAL HIGH (ref 100–199)
HDL: 37 mg/dL — ABNORMAL LOW (ref >39)
LDL Chol Calc (NIH): 95 mg/dL (ref 0–99)
Triglycerides: 414 mg/dL — ABNORMAL HIGH (ref 0–149)
VLDL Cholesterol Cal: 69 mg/dL — ABNORMAL HIGH (ref 5–40)

## 2022-07-03 LAB — HEPATIC FUNCTION PANEL
ALT: 48 IU/L — ABNORMAL HIGH (ref 0–44)
AST: 52 IU/L — ABNORMAL HIGH (ref 0–40)
Albumin: 4.4 g/dL (ref 3.9–4.9)
Alkaline Phosphatase: 110 IU/L (ref 44–121)
Bilirubin Total: 0.3 mg/dL (ref 0.0–1.2)
Bilirubin, Direct: 0.13 mg/dL (ref 0.00–0.40)
Total Protein: 7.3 g/dL (ref 6.0–8.5)

## 2022-07-03 LAB — HEPATITIS C ANTIBODY: Hep C Virus Ab: NONREACTIVE

## 2022-07-03 LAB — IRON: Iron: 64 ug/dL (ref 38–169)

## 2022-07-16 ENCOUNTER — Other Ambulatory Visit: Payer: Self-pay | Admitting: Medical

## 2022-07-16 ENCOUNTER — Telehealth: Payer: Self-pay | Admitting: Medical

## 2022-07-16 DIAGNOSIS — Z1211 Encounter for screening for malignant neoplasm of colon: Secondary | ICD-10-CM

## 2022-07-16 MED ORDER — ROSUVASTATIN CALCIUM 20 MG PO TABS: 20.0000 mg | ORAL_TABLET | Freq: Every day | ORAL | 2 refills | Status: DC

## 2022-07-16 MED ORDER — TADALAFIL 20 MG PO TABS: 20.0000 mg | ORAL_TABLET | Freq: Every day | ORAL | 1 refills | Status: DC | PRN

## 2022-07-16 NOTE — Telephone Encounter (Signed)
Pt came in and states that he needs to change his pharmacy. Please send remaining refills of rosuvastatin and tadalafil to [facility]

## 2022-07-16 NOTE — Telephone Encounter (Signed)
done

## 2022-09-03 ENCOUNTER — Other Ambulatory Visit: Payer: Self-pay | Admitting: Medical

## 2022-09-03 ENCOUNTER — Telehealth: Payer: Self-pay

## 2022-09-03 NOTE — Telephone Encounter (Signed)
error 

## 2022-09-03 NOTE — Telephone Encounter (Signed)
PT. Walked in the office stating his wife is getting a colonoscopy done with [facility] and he wants to know if you can refer him there to get a colonoscopy as well. He stated he has never had a colonoscopy and he is 1.

## 2022-09-06 ENCOUNTER — Other Ambulatory Visit: Payer: Self-pay

## 2022-09-06 ENCOUNTER — Encounter: Payer: Self-pay | Admitting: Internal Medicine

## 2022-09-06 DIAGNOSIS — Z1211 Encounter for screening for malignant neoplasm of colon: Secondary | ICD-10-CM

## 2022-09-07 ENCOUNTER — Encounter: Payer: Self-pay | Admitting: Internal Medicine

## 2022-09-24 ENCOUNTER — Ambulatory Visit (AMBULATORY_SURGERY_CENTER): Payer: 59

## 2022-09-24 VITALS — Ht 66.0 in | Wt 158.0 lb

## 2022-09-24 DIAGNOSIS — Z1211 Encounter for screening for malignant neoplasm of colon: Secondary | ICD-10-CM

## 2022-09-24 MED ORDER — NA SULFATE-K SULFATE-MG SULF 17.5-3.13-1.6 GM/177ML PO SOLN: 1.0000 | Freq: Once | ORAL | 0 refills | Status: AC

## 2022-09-24 NOTE — Progress Notes (Signed)
No egg or soy allergy known to patient  No issues known to pt with past sedation with any surgeries or procedures Patient denies ever being told they had issues or difficulty with intubation  No FH of Malignant Hyperthermia Pt is not on diet pills Pt is not on home 02  Pt is not on blood thinners  Pt denies issues with constipation; No A fib or A flutter Have any cardiac testing pending--NO Pt instructed to use [email or web address] or GoodRx for a price reduction on prep   Insurance verified during PV appt=[employer]  Patient's chart reviewed by Cathlyn Parsons CNRA prior to previsit and patient appropriate for the [facility].  Previsit completed and red dot placed by patient's name on their procedure day (on provider's schedule).    GoodRx coupon given to patient during PV appt; Additional time spent with patient for instructions;

## 2022-10-08 ENCOUNTER — Encounter: Payer: No Typology Code available for payment source | Admitting: Internal Medicine

## 2022-10-13 ENCOUNTER — Telehealth: Payer: Self-pay | Admitting: Internal Medicine

## 2022-10-13 ENCOUNTER — Encounter: Payer: Self-pay | Admitting: Internal Medicine

## 2022-10-13 NOTE — Telephone Encounter (Signed)
Patient called to reschedule his procedure because he has no transportation, rescheduled for 12/06/22.

## 2022-10-15 ENCOUNTER — Encounter: Payer: No Typology Code available for payment source | Admitting: Internal Medicine

## 2022-11-15 IMAGING — MR MR LUMBAR SPINE W/O CM
4 of 5 series · 26 of 48 positions shown · non-contrast
Comparison: None Available.

CLINICAL DATA: Lumbar radiculopathy with symptoms over 6 weeks. Low
back pain radiating to the bilateral legs

EXAM:
MRI LUMBAR SPINE WITHOUT CONTRAST
TECHNIQUE: Multiplanar, multisequence MR imaging of the lumbar spine was
performed. No intravenous contrast was administered.

[Series 2: T2 · sagittal · 4.0mm · 1.09mm/px · 5 of 18 slices shown (1 of 2)]
[im 1/18]
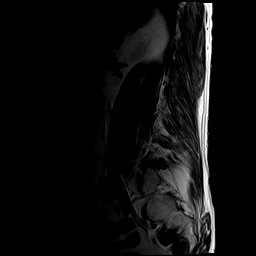
[im 5/18]
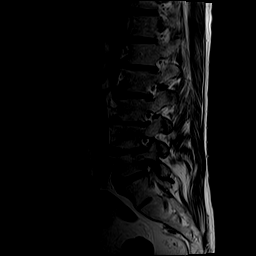
[im 9/18]
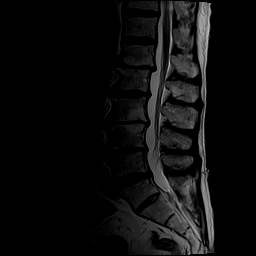
[im 13/18]
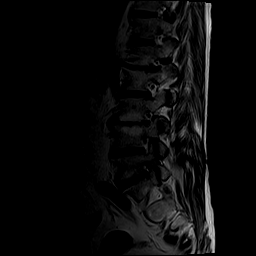
[im 18/18]
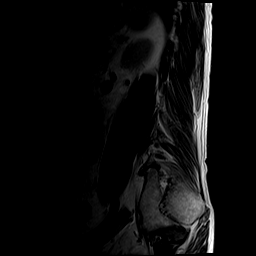

[Series 4: T1 · sagittal · 4.0mm · 1.09mm/px · 6 of 18 slices shown (1 of 2)]
[im 1/18]
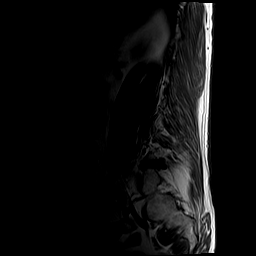
[im 4/18]
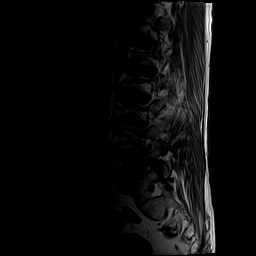
[im 7/18]
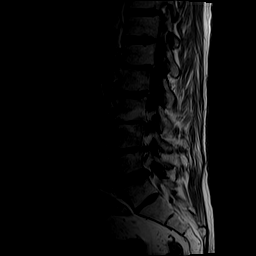
[im 11/18]
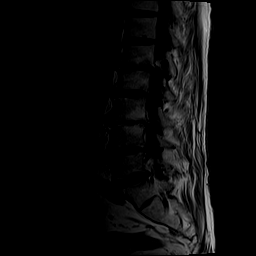
[im 14/18]
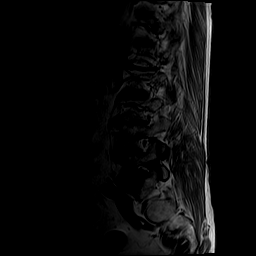
[im 18/18]
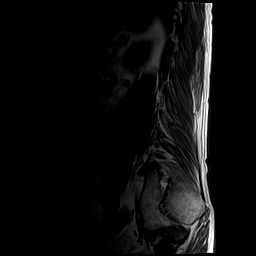

[Series 5: T2 · axial · 3.0mm · 0.43mm/px · z∈[-74,+121]mm · 10 of 50 slices shown (2 of 2)]
[im 4/50]
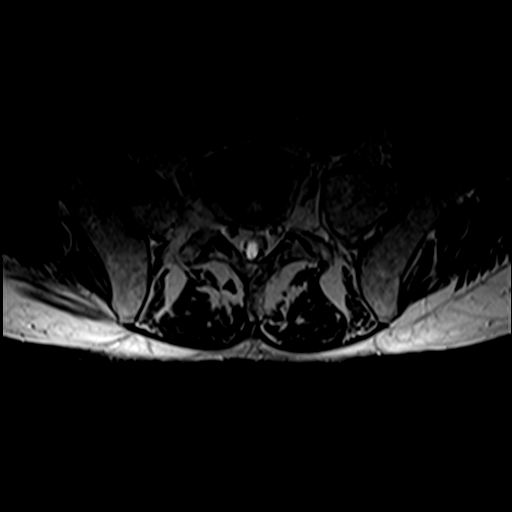
[im 7/50]
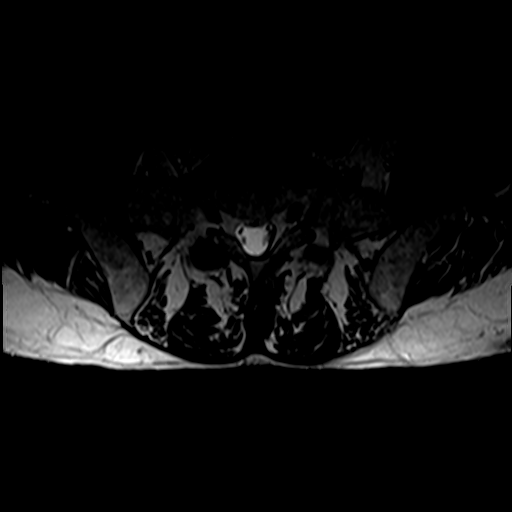
[im 10/50]
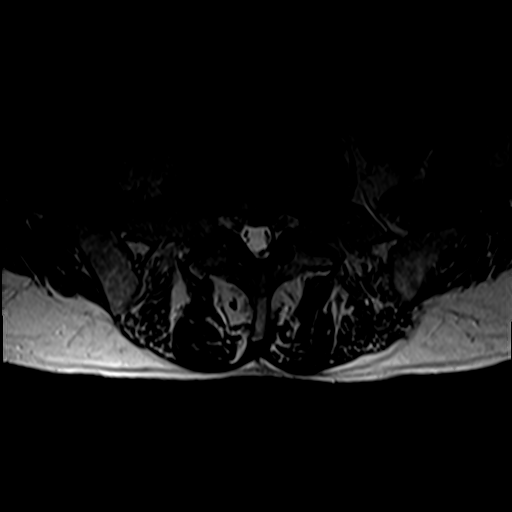
[im 17/50]
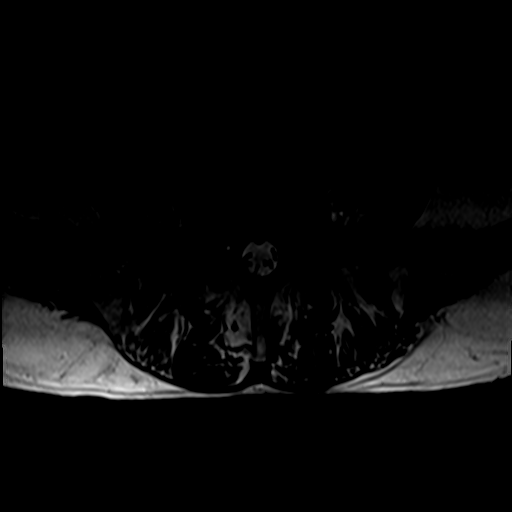
[im 23/50]
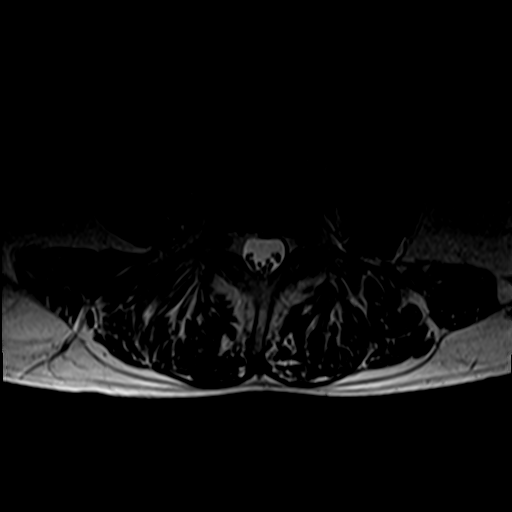
[im 27/50]
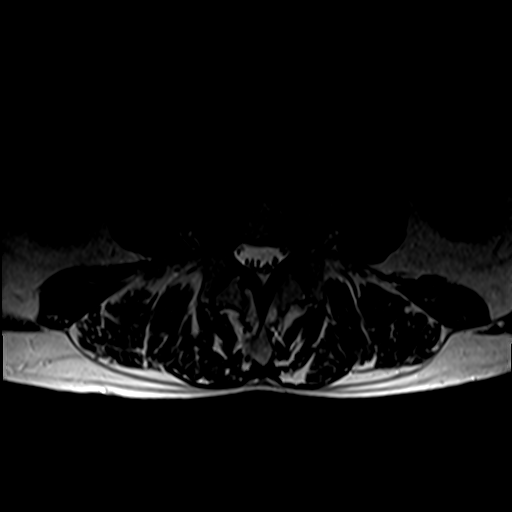
[im 30/50]
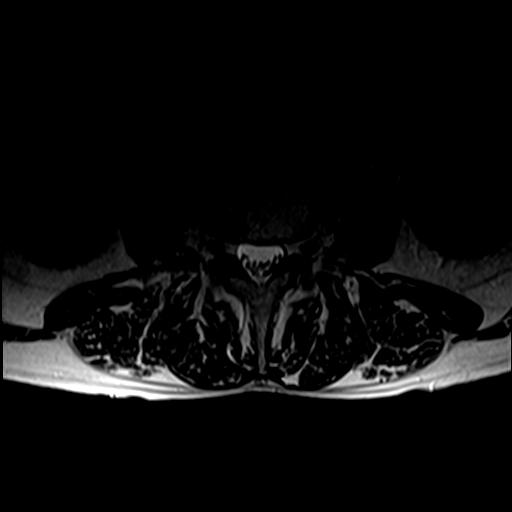
[im 36/50]
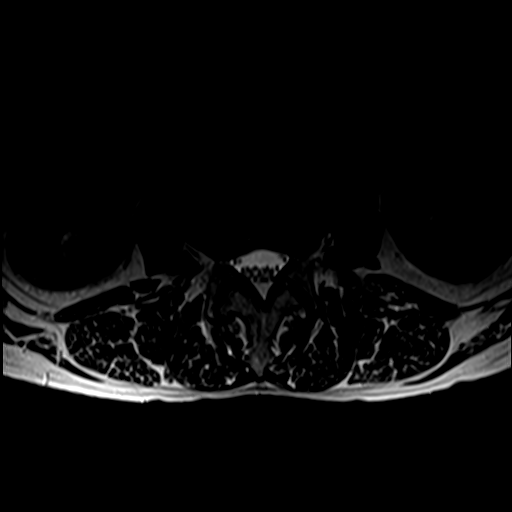
[im 43/50]
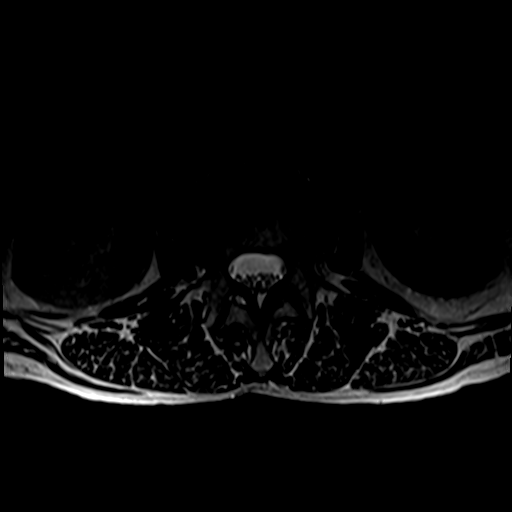
[im 50/50]
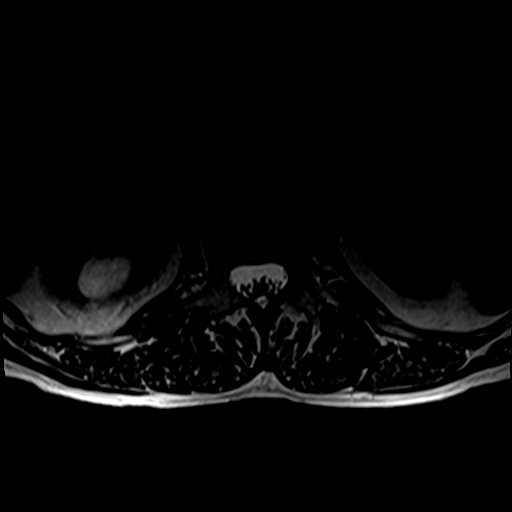

[Series 6: T1 · axial · 3.0mm · 0.39mm/px · z∈[-70,+97]mm · 5 of 50 slices shown (2 of 2)]
[im 4/50]
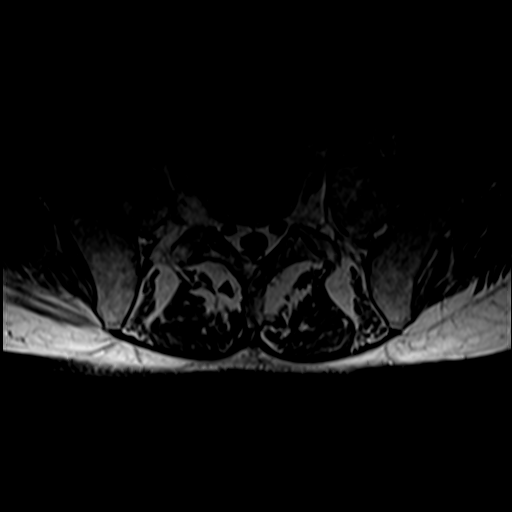
[im 7/50]
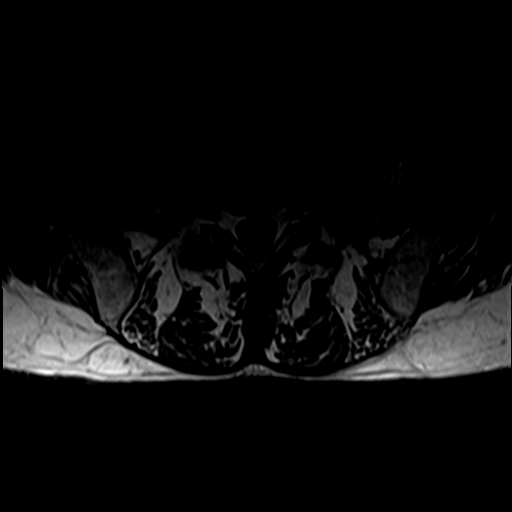
[im 10/50]
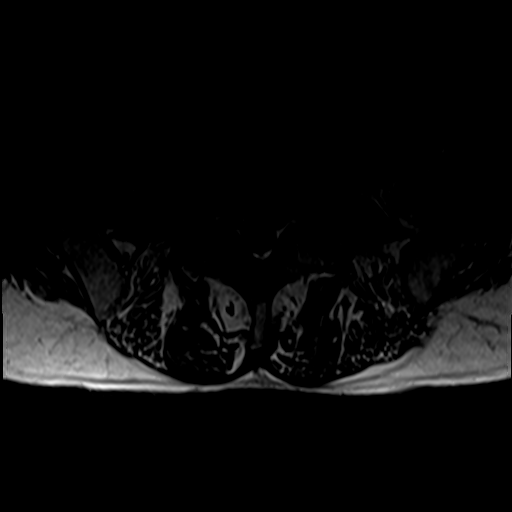
[im 27/50]
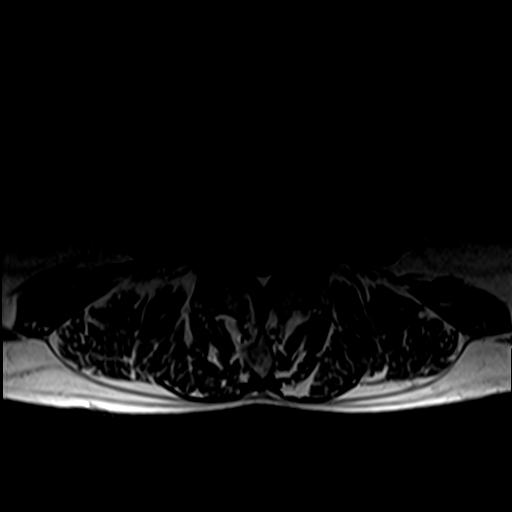
[im 43/50]
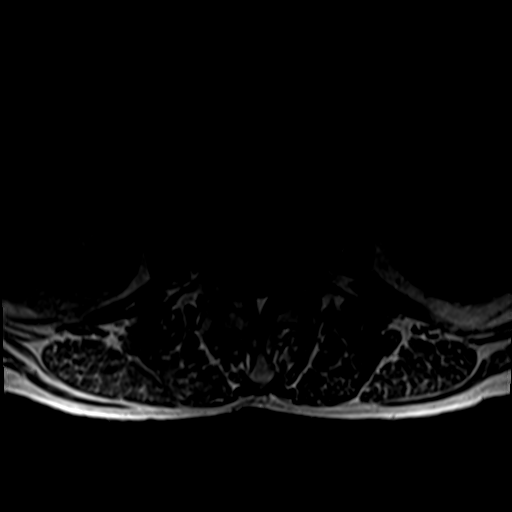

[26 of 48 positions shown; findings below may reference images not displayed]

FINDINGS: Segmentation: Transitional S1 vertebra with rudimentary S1-2
interspace.

Alignment:  Physiologic.

Vertebrae: Discogenic edematous signal at L1-2 and L5-S1.
Degenerative fatty marrow conversion at L3-4.

Conus medullaris and cauda equina: Conus extends to the L1-2 level.
Conus and cauda equina appear normal.

Paraspinal and other soft tissues: Negative for perispinal mass or
inflammation. Right renal cyst.

Disc levels:

T12- L1: Unremarkable.

L1-L2: Disc narrowing and bulging with endplate and facet spurring.
Mild to moderate left foraminal stenosis. Left paracentral
protrusion and ridging contacting the left L2 nerve root

L2-L3: Disc narrowing and bulging.  Mild facet spurring

L3-L4: Disc collapse with endplate ridging. Degenerative facet
spurring on both sides. Patent canal and foramina

L4-L5: Disc collapse with endplate ridging and disc bulging
eccentric towards the right foramen. Patent canal and foramina

L5-S1:Disc narrowing and bulging with degenerative facet spurring.
Right more than left foraminal impingement. Patent spinal canal.
IMPRESSION: Generalized lumbar spine degeneration most notable at L5-S1 where
there is right more than left foraminal impingement.

## 2022-12-02 ENCOUNTER — Telehealth: Payer: Self-pay | Admitting: Internal Medicine

## 2022-12-02 NOTE — Telephone Encounter (Signed)
Patient is calling to cancel his procedure for 2/12 @ 2:30 [place]. Will call back when able to reschedule

## 2022-12-06 ENCOUNTER — Encounter: Payer: No Typology Code available for payment source | Admitting: Internal Medicine

## 2023-01-12 ENCOUNTER — Telehealth: Payer: Self-pay | Admitting: Medical

## 2023-01-12 ENCOUNTER — Other Ambulatory Visit: Payer: Self-pay | Admitting: Medical

## 2023-01-12 MED ORDER — MELOXICAM 15 MG PO TABS: 15.0000 mg | ORAL_TABLET | Freq: Every day | ORAL | 0 refills | Status: DC

## 2023-01-12 NOTE — Telephone Encounter (Signed)
Pt walked in at 4:50pm today and was wanting something for pain medication. Gabriel Cirri talked to patient and advised him per Audelia Acton that I could send a message to [place] and he could look at it later on.   Pt was advised that he needed to go back to ortho or schedule with [place]. Pt declined to schedule and wanted to be called in the morning.   If you can send something in for sciatica pain to [facility]

## 2023-01-13 NOTE — Telephone Encounter (Signed)
Pt was notified.  

## 2023-02-17 ENCOUNTER — Other Ambulatory Visit: Payer: Self-pay | Admitting: Medical

## 2023-02-17 ENCOUNTER — Telehealth: Payer: Self-pay | Admitting: Medical

## 2023-02-17 MED ORDER — MELOXICAM 15 MG PO TABS: 15.0000 mg | ORAL_TABLET | Freq: Every day | ORAL | 1 refills | Status: AC

## 2023-02-17 MED ORDER — TADALAFIL 20 MG PO TABS
20.0000 mg | ORAL_TABLET | Freq: Every day | ORAL | 3 refills | Status: DC | PRN
Start: 2023-02-17 — End: 2024-04-25

## 2023-02-17 NOTE — Telephone Encounter (Signed)
Pt came in and requested refills on meloxicam and tadalafil. Please send to [facility] on [address]. Pt can be reached at [phone].

## 2024-04-25 ENCOUNTER — Encounter: Payer: Self-pay | Admitting: Medical

## 2024-04-25 ENCOUNTER — Ambulatory Visit (INDEPENDENT_AMBULATORY_CARE_PROVIDER_SITE_OTHER): Payer: Self-pay | Admitting: Medical

## 2024-04-25 VITALS — BP 122/74 | HR 96 | Ht 65.75 in | Wt 153.2 lb

## 2024-04-25 DIAGNOSIS — Z131 Encounter for screening for diabetes mellitus: Secondary | ICD-10-CM

## 2024-04-25 DIAGNOSIS — I2510 Atherosclerotic heart disease of native coronary artery without angina pectoris: Secondary | ICD-10-CM

## 2024-04-25 DIAGNOSIS — E782 Mixed hyperlipidemia: Secondary | ICD-10-CM

## 2024-04-25 DIAGNOSIS — R7989 Other specified abnormal findings of blood chemistry: Secondary | ICD-10-CM

## 2024-04-25 DIAGNOSIS — Z122 Encounter for screening for malignant neoplasm of respiratory organs: Secondary | ICD-10-CM

## 2024-04-25 DIAGNOSIS — I83893 Varicose veins of bilateral lower extremities with other complications: Secondary | ICD-10-CM | POA: Diagnosis not present

## 2024-04-25 DIAGNOSIS — Z1389 Encounter for screening for other disorder: Secondary | ICD-10-CM

## 2024-04-25 DIAGNOSIS — R231 Pallor: Secondary | ICD-10-CM

## 2024-04-25 DIAGNOSIS — Z1211 Encounter for screening for malignant neoplasm of colon: Secondary | ICD-10-CM

## 2024-04-25 DIAGNOSIS — Z0000 Encounter for general adult medical examination without abnormal findings: Secondary | ICD-10-CM

## 2024-04-25 DIAGNOSIS — I252 Old myocardial infarction: Secondary | ICD-10-CM

## 2024-04-25 DIAGNOSIS — Z125 Encounter for screening for malignant neoplasm of prostate: Secondary | ICD-10-CM

## 2024-04-25 DIAGNOSIS — I739 Peripheral vascular disease, unspecified: Secondary | ICD-10-CM

## 2024-04-25 NOTE — Progress Notes (Signed)
 Subjective:    Alexander Larson is a 63 y.o. male who presents for Preventative Services visit and chronic medical problems/med check visit.    Primary Care Provider Kaidance Pantoja, Alm RAMAN, PA-C here for primary care  Current Health Care Team: Dentist Eye doctor Dr. Victory Sharps, cardiology in past   Medical Services you may have received from other than [facility] providers in the past year (date may be approximate) none  Exercise Current exercise habits: some   Nutrition/Diet Current diet: in general, a healthy diet    Depression Screen    04/25/2024    2:55 PM  Depression screen PHQ 2/9  Decreased Interest 0  Down, Depressed, Hopeless 0  PHQ - 2 Score 0    Activities of Daily Living Screen/Functional Status Survey Is the patient deaf or have difficulty hearing?: No Does the patient have difficulty seeing, even when wearing glasses/contacts?: No Does the patient have difficulty concentrating, remembering, or making decisions?: No Does the patient have difficulty walking or climbing stairs?: No Does the patient have difficulty dressing or bathing?: No Does the patient have difficulty doing errands alone such as visiting a doctor's office or shopping?: No  Can patient draw a clock face showing 3:15 oclock, yes  Fall Risk Screen    04/25/2024    2:55 PM 05/07/2022    3:06 PM 03/16/2022   11:22 AM 08/22/2017    3:02 PM  Fall Risk   Falls in the past year? 0 0 0 No   Number falls in past yr: 0 0 0   Injury with Fall? 0 0 0   Risk for fall due to : No Fall Risks No Fall Risks No Fall Risks   Follow up Falls evaluation completed Falls evaluation completed  Falls evaluation completed       Data saved with a previous flowsheet row definition    Gait Assessment: Normal gait observed yes  Advanced directives Does patient have a Health Care Power of Attorney? No Does patient have a Living Will? No  Past Medical History:  Diagnosis Date   Alcohol abuse 04/05/2017   CAD  (coronary artery disease)    04/05/17 PCI DES--RCA, EF 55% // Myoview  10/2018: EF 57, normal perfusion; Low Risk   Chronic pain    in back   Constipation 04/15/2017   Hyperlipidemia    Mixed hyperlipidemia    on meds   Osteoarthritis    bilateral knees   s/p Inferior STEMI 03/2017 tx with DES to RCA 04/05/2017   Sciatica    Tobacco abuse 04/05/2017   Varicose veins of both lower extremities with pain     Past Surgical History:  Procedure Laterality Date   CORONARY STENT INTERVENTION N/A 04/05/2017   Procedure: Coronary Stent Intervention;  Surgeon: Sharps Victory ORN, MD;  Location: [facility] INVASIVE CV LAB;  Service: Cardiovascular;  Laterality: N/A;   LEFT HEART CATH AND CORONARY ANGIOGRAPHY N/A 04/05/2017   Procedure: Left Heart Cath and Coronary Angiography;  Surgeon: Sharps Victory ORN, MD;  Location: [facility] INVASIVE CV LAB;  Service: Cardiovascular;  Laterality: N/A;    Social History   Socioeconomic History   Marital status: Married    Spouse name: Not on file   Number of children: Not on file   Years of education: Not on file   Highest education level: Not on file  Occupational History   Not on file  Tobacco Use   Smoking status: Every Day    Current packs/day: 0.25    Average  packs/day: 0.3 packs/day for 45.0 years (11.3 ttl pk-yrs)    Types: Cigarettes   Smokeless tobacco: Never  Vaping Use   Vaping status: Never Used  Substance and Sexual Activity   Alcohol use: Yes    Alcohol/week: 35.0 standard drinks of alcohol    Types: 35 Cans of beer per week    Comment: 5-6 beers per day   Drug use: No   Sexual activity: Not Currently  Other Topics Concern   Not on file  Social History Narrative   Not on file   Social Drivers of Health   Financial Resource Strain: Low Risk  (04/25/2024)   Overall Financial Resource Strain (CARDIA)    Difficulty of Paying Living Expenses: Not hard at all  Food Insecurity: Not on file  Transportation Needs: No Transportation Needs (04/25/2024)    PRAPARE - Administrator, Civil Service (Medical): No    Lack of Transportation (Non-Medical): No  Physical Activity: Inactive (04/25/2024)   Exercise Vital Sign    Days of Exercise per Week: 0 days    Minutes of Exercise per Session: 0 min  Stress: No Stress Concern Present (04/25/2024)   [employer] of Occupational Health - Occupational Stress Questionnaire    Feeling of Stress: Only a little  Social Connections: Moderately Isolated (04/25/2024)   Social Connection and Isolation Panel    Frequency of Communication with Friends and Family: More than three times a week    Frequency of Social Gatherings with Friends and Family: More than three times a week    Attends Religious Services: 1 to 4 times per year    Active Member of [employer] or Organizations: No    Attends Banker Meetings: Never    Marital Status: Widowed  Intimate Partner Violence: Not At Risk (04/25/2024)   Humiliation, Afraid, Rape, and Kick questionnaire    Fear of Current or Ex-Partner: No    Emotionally Abused: No    Physically Abused: No    Sexually Abused: No    Family History  Problem Relation Age of Onset   Hypertension Father    Lung cancer Father 20       brick mason    No current outpatient medications on file.  Allergies  Allergen Reactions   Carvedilol  Rash    History reviewed: allergies, current medications, past family history, past medical history, past social history, past surgical history and problem list  Chronic issues discussed: none  Acute issues discussed: none  Objective:      Biometrics BP 122/74   Pulse 96   Ht 5' 5.75 (1.67 m)   Wt 153 lb 3.2 oz (69.5 kg)   SpO2 96%   BMI 24.92 kg/m   Gen: wd wn, nad Linear scar on right scalp and linear scar on  right lower chest HEENT: normocephalic, sclerae anicteric, TMs pearly, nares patent, no discharge or erythema, pharynx normal Oral cavity: MMM, no lesions Neck: supple, no lymphadenopathy, no  thyromegaly, no masses, no bruits Heart: RRR, normal S1, S2, no murmurs Lungs: CTA bilaterally, no wheezes, rhonchi, or rales Abdomen: +bs, soft, somewhat mottled coloration of abdomen that could suggest livedo reticularis, non tender, non distended, no masses, no hepatomegaly, no splenomegaly Musculoskeletal: nontender, no swelling, no obvious deformity Extremities: no edema, no cyanosis, no clubbing Pulses: 2+ symmetric, upper and lower extremities, normal cap refill Neurological: alert, oriented x 3, CN2-12 intact, strength normal upper extremities and lower extremities, sensation normal throughout, DTRs 2+ throughout, no cerebellar signs,  gait normal Psychiatric: normal affect, behavior normal, pleasant  Declined GU/rectal   Assessment:   Encounter Diagnoses  Name Primary?   Welcome to Medicare preventive visit Yes   Coronary artery disease involving native coronary artery of native heart without angina pectoris    Varicose veins of bilateral lower extremities with other complications    Screening for prostate cancer    Screening for diabetes mellitus    s/p Inferior STEMI 03/2017 tx with DES to RCA    PAD (peripheral artery disease) (HCC)    Mixed hyperlipidemia    Screening for colon cancer    Screening for hematuria or proteinuria    Screening for lung cancer    Elevated LFTs    Livedo reticularis      Plan:   A preventative services visit was completed today.  During the course of the visit today, we discussed and counseled about appropriate screening and preventive services.  A health risk assessment was established today that included a review of current medications, allergies, social history, family history, medical and preventative health history, biometrics, and preventative screenings to identify potential safety concerns or impairments.  A personalized plan was printed today for your records and use.   Personalized health advice and education was given today to  reduce health risks and promote self management and wellness.  Information regarding end of life planning was discussed today.   Separate significant issues discussed: Tobacco use-I advised smoking cessation.  He is a long-term smoker.  He has a history of alcohol abuse.  He says he is currently not drinking much alcohol.  Updated labs today  Hyperlipidemia, history of cardiac stent, history of STEMI-noncompliant with treatment from prior recommendations  Varicose veins-I recommend regular exercise, compression hose    General Recommendations: Continue to return yearly for your annual wellness and preventative care visits.  This gives us  a chance to discuss healthy lifestyle, exercise, vaccinations, review your chart record, and perform screenings where appropriate.  I recommend you see your eye doctor yearly for routine vision care.  I recommend you see your dentist yearly for routine dental care including hygiene visits twice yearly.   Vaccination  Immunization History  Administered Date(s) Administered   PFIZER(Purple Top)SARS-COV-2 Vaccination 01/21/2020, 02/13/2020   PNEUMOCOCCAL CONJUGATE-20 05/07/2022   Tdap 08/22/2017    Vaccine recommendations: Shingrix, flu, covid  Vaccines administered today: None, declined   Screening for cancer: Colon cancer screening: He declines today but will consider colonoscopy.  Prostate Cancer screening: The recommended prostate cancer screening test is a blood test called the prostate-specific antigen (PSA) test. PSA is a protein that is made in the prostate. As you age, your prostate naturally produces more PSA. Abnormally high PSA levels may be caused by: Prostate cancer. An enlarged prostate that is not caused by cancer (benign prostatic hyperplasia, or BPH). This condition is very common in older men. A prostate gland infection (prostatitis) or urinary tract infection. Certain medicines such as male hormones (like testosterone)  or other medicines that raise testosterone levels. A rectal exam may be done as part of prostate cancer screening to help provide information about the size of your prostate gland. When a rectal exam is performed, it should be done after the PSA level is drawn to avoid any effect on the results.   Skin cancer screening: Check your skin regularly for new changes, growing lesions, or other lesions of concern Come in for evaluation if you have skin lesions of concern.   Lung cancer screening:  If you have a greater than 20 pack year history of tobacco use, then you may qualify for lung cancer screening with a chest CT scan.   Please call your insurance company to inquire about coverage for this test. Referral for CT chest  Pancreatic cancer:  no current screening test is available or routinely recommended. (risk factors: smoking, overweight or obese, diabetes, chronic pancreatitis, work exposure - dry cleaning, metal working, 65yo>, M>F, Tree surgeon, family hx/o, hereditary breast, ovarian, melanoma, lynch, peutz-jeghers).  Symptoms: jaundice, dark urine, light color or greasy stools, itchy skin, belly or back pain, weight loss, poor appetite, nausea, vomiting, liver enlargement, DVT/blood clots.   We currently don't have screenings for other cancers besides breast, cervical, colon, and lung cancers.  If you have a strong family history of cancer or have other cancer screening concerns, please let me know.  Genetic testing referral is an option for individuals with high cancer risk in the family.  There are some other cancer screenings in development currently.   Bone health: Get at least 150 minutes of aerobic exercise weekly Get weight bearing exercise at least once weekly Bone density test:  A bone density test is an imaging test that uses a type of X-ray to measure the amount of calcium  and other minerals in your bones. The test may be used to diagnose or screen you for a condition that  causes weak or thin bones (osteoporosis), predict your risk for a broken bone (fracture), or determine how well your osteoporosis treatment is working. The bone density test is recommended for females 65 and older, or females or males <65 if certain risk factors such as thyroid disease, long term use of steroids such as for asthma or rheumatological issues, vitamin D deficiency, estrogen deficiency, family history of osteoporosis, self or family history of fragility fracture in first degree relative.    Heart health: Get at least 150 minutes of aerobic exercise weekly Limit alcohol It is important to maintain a healthy blood pressure and healthy cholesterol numbers  Heart disease screening: Screening for heart disease includes screening for blood pressure, fasting lipids, glucose/diabetes screening, BMI height to weight ratio, reviewed of smoking status, physical activity, and diet.    Goals include blood pressure 120/80 or less, maintaining a healthy lipid/cholesterol profile, preventing diabetes or keeping diabetes numbers under good control, not smoking or using tobacco products, exercising most days per week or at least 150 minutes per week of exercise, and eating healthy variety of fruits and vegetables, healthy oils, and avoiding unhealthy food choices like fried food, fast food, high sugar and high cholesterol foods.    Other tests may possibly include EKG test, CT coronary calcium  score, echocardiogram, exercise treadmill stress test.     Declines cardiology consult or EKG today   Vascular disease screening: For higher risk individuals including smokers, diabetics, patients with known heart disease or high blood pressure, kidney disease, and others, screening for vascular disease or atherosclerosis of the arteries is available.  Examples may include carotid ultrasound, abdominal aortic ultrasound, ABI blood flow screening in the legs, thoracic aorta screening.   Medical care  options: I recommend you continue to seek care here first for routine care.  We try really hard to have available appointments Monday through Friday daytime hours for sick visits, acute visits, and physicals.  Urgent care should be used for after hours and weekends for significant issues that cannot wait till the next day.  The emergency department should be used for  significant potentially life-threatening emergencies.  The emergency department is expensive, can often have long wait times for less significant concerns, so try to utilize primary care, urgent care, or telemedicine when possible to avoid unnecessary trips to the emergency department.  Virtual visits and telemedicine have been introduced since the pandemic started in 2020, and can be convenient ways to receive medical care.  We offer virtual appointments as well to assist you in a variety of options to seek medical care.   Legal  Take the time to do a last will and testament, Advanced Directives including Health Care Power of Attorney and Living Will documents.  Don't leave your family with burdens that can be handled ahead of time.   Advanced Directives: I recommend you consider completing a Health Care Power of Attorney and Living Will.   These documents respect your wishes and help alleviate burdens on your loved ones if you were to become terminally ill or be in a position to need those documents enforced.    You can complete Advanced Directives yourself, have them notarized, then have copies made for our office, for you and for anybody you feel should have them in safe keeping.  Or, you can have an attorney prepare these documents.   If you haven't updated your Last Will and Testament in a while, it may be worthwhile having an attorney prepare these documents together and save on some costs.         Jesselee was seen today for [employer].  Diagnoses and all orders for this visit:  Welcome to Medicare preventive  visit  Coronary artery disease involving native coronary artery of native heart without angina pectoris -     TSH  Varicose veins of bilateral lower extremities with other complications  Screening for prostate cancer -     PSA  Screening for diabetes mellitus -     Hemoglobin A1c  s/p Inferior STEMI 03/2017 tx with DES to RCA -     Lipid panel  PAD (peripheral artery disease) (HCC) -     Lipid panel  Mixed hyperlipidemia -     Lipid panel  Screening for colon cancer  Screening for hematuria or proteinuria -     Comprehensive metabolic panel with GFR -     Urinalysis, Routine w reflex microscopic  Screening for lung cancer -     CT CHEST LUNG CA SCREEN LOW DOSE W/O CM; Future  Elevated LFTs -     CBC with Differential/Platelet -     PT and PTT  Livedo reticularis    Follow-up pending labs, yearly for physical      Medicare Attestation A preventative services visit was completed today.  During the course of the visit the patient was educated and counseled about appropriate screening and preventive services.  A health risk assessment was established with the patient that included a review of current medications, allergies, social history, family history, medical and preventative health history, biometrics, and preventative screenings to identify potential safety concerns or impairments.  A personalized plan was printed today for the patient's records and use.   Personalized health advice and education was given today to reduce health risks and promote self management and wellness.  Information regarding end of life planning was discussed today.  Ludie Gent, PA-C   04/25/2024

## 2024-04-26 ENCOUNTER — Other Ambulatory Visit: Payer: Self-pay | Admitting: Medical

## 2024-04-26 ENCOUNTER — Ambulatory Visit: Payer: Self-pay | Admitting: Medical

## 2024-04-26 DIAGNOSIS — R7989 Other specified abnormal findings of blood chemistry: Secondary | ICD-10-CM

## 2024-04-26 DIAGNOSIS — D696 Thrombocytopenia, unspecified: Secondary | ICD-10-CM

## 2024-04-26 LAB — URINALYSIS, ROUTINE W REFLEX MICROSCOPIC
Glucose, UA: NEGATIVE
Ketones, UA: NEGATIVE
Nitrite, UA: POSITIVE — AB
Specific Gravity, UA: 1.021 (ref 1.005–1.030)
Urobilinogen, Ur: 1 mg/dL (ref 0.2–1.0)
pH, UA: 5.5 (ref 5.0–7.5)

## 2024-04-26 LAB — MICROSCOPIC EXAMINATION
Casts: NONE SEEN /LPF
WBC, UA: >30 /HPF — AB (ref 0–5)

## 2024-04-26 LAB — LIPID PANEL
Chol/HDL Ratio: 4.8 ratio (ref 0.0–5.0)
Cholesterol, Total: 311 mg/dL — ABNORMAL HIGH (ref 100–199)
HDL: 65 mg/dL (ref >39)
LDL Chol Calc (NIH): 215 mg/dL — ABNORMAL HIGH (ref 0–99)
Triglycerides: 165 mg/dL — ABNORMAL HIGH (ref 0–149)
VLDL Cholesterol Cal: 31 mg/dL (ref 5–40)

## 2024-04-26 LAB — COMPREHENSIVE METABOLIC PANEL WITH GFR
ALT: 91 IU/L — ABNORMAL HIGH (ref 0–44)
AST: 110 IU/L — ABNORMAL HIGH (ref 0–40)
Albumin: 4.4 g/dL (ref 3.9–4.9)
Alkaline Phosphatase: 156 IU/L — ABNORMAL HIGH (ref 44–121)
BUN/Creatinine Ratio: 20 (ref 10–24)
BUN: 15 mg/dL (ref 8–27)
Bilirubin Total: 1.3 mg/dL — ABNORMAL HIGH (ref 0.0–1.2)
CO2: 16 mmol/L — ABNORMAL LOW (ref 20–29)
Calcium: 9.6 mg/dL (ref 8.6–10.2)
Chloride: 99 mmol/L (ref 96–106)
Creatinine, Ser: 0.75 mg/dL — ABNORMAL LOW (ref 0.76–1.27)
Globulin, Total: 3 g/dL (ref 1.5–4.5)
Glucose: 114 mg/dL — ABNORMAL HIGH (ref 70–99)
Potassium: 4.2 mmol/L (ref 3.5–5.2)
Sodium: 136 mmol/L (ref 134–144)
Total Protein: 7.4 g/dL (ref 6.0–8.5)
eGFR: 100 mL/min/{1.73_m2} (ref >59)

## 2024-04-26 LAB — CBC WITH DIFFERENTIAL/PLATELET
Basophils Absolute: 0.1 10*3/uL (ref 0.0–0.2)
Basos: 1 %
EOS (ABSOLUTE): 0.1 10*3/uL (ref 0.0–0.4)
Eos: 1 %
Hematocrit: 51.3 % — ABNORMAL HIGH (ref 37.5–51.0)
Hemoglobin: 17.6 g/dL (ref 13.0–17.7)
Immature Grans (Abs): 0 10*3/uL (ref 0.0–0.1)
Immature Granulocytes: 0 %
Lymphocytes Absolute: 1.1 10*3/uL (ref 0.7–3.1)
Lymphs: 17 %
MCH: 32.8 pg (ref 26.6–33.0)
MCHC: 34.3 g/dL (ref 31.5–35.7)
MCV: 96 fL (ref 79–97)
Monocytes Absolute: 0.6 10*3/uL (ref 0.1–0.9)
Monocytes: 9 %
Neutrophils Absolute: 4.3 10*3/uL (ref 1.4–7.0)
Neutrophils: 72 %
Platelets: 56 10*3/uL — CL (ref 150–450)
RBC: 5.36 x10E6/uL (ref 4.14–5.80)
RDW: 12.8 % (ref 11.6–15.4)
WBC: 6.1 10*3/uL (ref 3.4–10.8)

## 2024-04-26 LAB — PSA: Prostate Specific Ag, Serum: 1.5 ng/mL (ref 0.0–4.0)

## 2024-04-26 LAB — HEMOGLOBIN A1C
Est. average glucose Bld gHb Est-mCnc: 126 mg/dL
Hgb A1c MFr Bld: 6 % — ABNORMAL HIGH (ref 4.8–5.6)

## 2024-04-26 LAB — PT AND PTT
INR: 1 (ref 0.9–1.2)
Prothrombin Time: 10.9 s (ref 9.1–12.0)
aPTT: 29 s (ref 24–33)

## 2024-04-26 LAB — TSH: TSH: 2.75 u[IU]/mL (ref 0.450–4.500)

## 2024-04-26 NOTE — Progress Notes (Signed)
 Labs show platelets quite low.  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  The rest of your blood counts are relatively okay.  Liver tests are elevated as they have been in the past but a little worse, blood sugar elevated suggesting prediabetes.  Cholesterol is way too high putting you at risk for heart disease and stroke.  Thyroid okay.  Prostate marker okay.  I recommend a scan of your abdomen and liver  I am going to refer you to hematology regarding the low platelets as they are quite low putting at risk of bleeding risk  I will need to see you back after the ultrasound to discuss these findings and other recommendations

## 2024-05-09 ENCOUNTER — Ambulatory Visit
Admission: RE | Admit: 2024-05-09 | Discharge: 2024-05-09 | Disposition: A | Discharge status: Home / Self Care | Source: Ambulatory Visit | Attending: Medical | Admitting: Medical

## 2024-05-09 DIAGNOSIS — R7989 Other specified abnormal findings of blood chemistry: Secondary | ICD-10-CM

## 2024-05-09 DIAGNOSIS — Z122 Encounter for screening for malignant neoplasm of respiratory organs: Secondary | ICD-10-CM

## 2024-05-09 DIAGNOSIS — D696 Thrombocytopenia, unspecified: Secondary | ICD-10-CM

## 2024-05-09 MED ORDER — IOPAMIDOL (ISOVUE-300) INJECTION 61%
100.0000 mL | Freq: Once | INTRAVENOUS | Status: AC | PRN
  Administered 2024-05-09: 100 mL via INTRAVENOUS

## 2024-05-14 ENCOUNTER — Ambulatory Visit: Payer: Self-pay | Admitting: Medical

## 2024-05-14 NOTE — Progress Notes (Signed)
 Your scan shows abnormalities of the liver suggesting scarring or fatty deposits.  I have placed a referral recently to gastroenterology.  You should be getting a phone call about scheduling with the liver specialist  Your bladder also looks inflamed but I do not think you are having any urinary symptoms.  If you are having urinary symptoms let me know such as burning with urination, blood in the urine, lower middle pelvic pain or testicle discomfort.  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  This is a weakening in the artery wall.  Right now the recommendation is to monitor this with ultrasound in another year or 2  Sabrina - Verify when his GI appointment is scheduled?

## 2024-05-18 ENCOUNTER — Inpatient Hospital Stay

## 2024-05-18 ENCOUNTER — Inpatient Hospital Stay: Attending: Hematology and Oncology | Admitting: Hematology and Oncology

## 2024-05-18 ENCOUNTER — Encounter: Payer: Self-pay | Admitting: Hematology and Oncology

## 2024-05-18 VITALS — BP 142/104 | HR 94 | Temp 99.2°F | Resp 18 | Ht 65.75 in | Wt 150.8 lb

## 2024-05-18 DIAGNOSIS — F1010 Alcohol abuse, uncomplicated: Secondary | ICD-10-CM | POA: Insufficient documentation

## 2024-05-18 DIAGNOSIS — Z801 Family history of malignant neoplasm of trachea, bronchus and lung: Secondary | ICD-10-CM | POA: Diagnosis not present

## 2024-05-18 DIAGNOSIS — F17210 Nicotine dependence, cigarettes, uncomplicated: Secondary | ICD-10-CM | POA: Diagnosis not present

## 2024-05-18 DIAGNOSIS — Z720 Tobacco use: Secondary | ICD-10-CM

## 2024-05-18 DIAGNOSIS — D696 Thrombocytopenia, unspecified: Secondary | ICD-10-CM | POA: Diagnosis present

## 2024-05-18 DIAGNOSIS — K709 Alcoholic liver disease, unspecified: Secondary | ICD-10-CM | POA: Diagnosis not present

## 2024-05-18 DIAGNOSIS — F17290 Nicotine dependence, other tobacco product, uncomplicated: Secondary | ICD-10-CM | POA: Diagnosis not present

## 2024-05-18 NOTE — Assessment & Plan Note (Signed)
 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

## 2024-05-18 NOTE — Progress Notes (Signed)
 [facility] CONSULT NOTE  Patient Care Team: Tysinger, Alm RAMAN, PA-C as PCP - General (Family Medicine) Claudene Victory ORN, MD (Inactive) as PCP - Cardiology (Cardiology)  ASSESSMENT & PLAN Thrombocytopenia [facility]) I have reviewed extensive workup already performed by his primary care doctor The most likely cause of his thrombocytopenia is from alcoholic liver disease and it is not likely to improve unless the patient stay abstinent or reduce his alcohol intake Rather than repeating labs today, I recommend the patient to attempt to reduce his alcohol intake to less than 2 beers per day and I will repeat his labs in the month for further follow-up I will check B12 level in his next visit  Alcoholic liver disease (HCC) I have reviewed his CT imaging which confirmed diagnosis of liver disease, in his case, likely secondary to chronic alcohol use We discussed importance of staying abstinent to prevent further liver damage  Alcohol abuse I recommend the patient attempt to reduce his alcohol intake down to 2 or less beer per day  Tobacco abuse We discussed importance of tobacco cessation but it does not appear that the patient is interested to quit  Orders Placed This Encounter  Procedures   CMP (Cancer Center only)    Standing Status:   Future    Expiration Date:   05/18/2025   CBC with Differential (Cancer Center Only)    Standing Status:   Future    Expiration Date:   05/18/2025   Vitamin B12    Standing Status:   Future    Expiration Date:   05/18/2025   All questions were answered. The patient knows to call the clinic with any problems, questions or concerns. No barriers to learning was detected. The total time spent in the appointment was 60 minutes encounter with patients including review of chart and various tests results, discussions about plan of care and coordination of care plan  Almarie Bedford, MD 05/18/2024 1:51 PM  CHIEF COMPLAINTS/PURPOSE OF CONSULTATION:   Thrombocytopenia  HISTORY OF PRESENTING ILLNESS:  Alexander Larson 63 y.o. male is here because of thrombocytopenia.  He was found to have abnormal CBC from recent blood work I have the opportunity to review his CBC dated back to 2018 On April 05, 2017, platelet count was 190 On August 22, 2017, platelet count was 198 On May 07, 2022, platelet count was 167 On April 25, 2024, platelet count was 56 He denies recent bruising/bleeding, such as spontaneous epistaxis, hematuria, melena or hematochezia The patient denies history of liver disease or hepatitis He denies prior blood or platelet transfusions He has significant history of heart disease, hyperlipidemia and known to have chronic alcohol abuse and smoking According to the patient, he started smoking at least around the age of 42 and usually smoke about 10 cigar/day He has been drinking 1-2 beers for over 40 years but most recently, might drink up to 5 beers a day  MEDICAL HISTORY:  Past Medical History:  Diagnosis Date   Alcohol abuse 04/05/2017   Alcoholic liver disease (HCC) 05/18/2024   CAD (coronary artery disease)    04/05/17 PCI DES--RCA, EF 55% // Myoview  10/2018: EF 57, normal perfusion; Low Risk   Chronic pain    in back   Constipation 04/15/2017   Hyperlipidemia    Mixed hyperlipidemia    on meds   Osteoarthritis    bilateral knees   s/p Inferior STEMI 03/2017 tx with DES to RCA 04/05/2017   Sciatica    Thrombocytopenia (  HCC) 05/18/2024   Tobacco abuse 04/05/2017   Varicose veins of both lower extremities with pain     SURGICAL HISTORY: Past Surgical History:  Procedure Laterality Date   CORONARY STENT INTERVENTION N/A 04/05/2017   Procedure: Coronary Stent Intervention;  Surgeon: Claudene Victory ORN, MD;  Location: [facility] INVASIVE CV LAB;  Service: Cardiovascular;  Laterality: N/A;   LEFT HEART CATH AND CORONARY ANGIOGRAPHY N/A 04/05/2017   Procedure: Left Heart Cath and Coronary Angiography;  Surgeon: Claudene Victory ORN, MD;   Location: [facility] INVASIVE CV LAB;  Service: Cardiovascular;  Laterality: N/A;    SOCIAL HISTORY: Social History   Socioeconomic History   Marital status: Married    Spouse name: Not on file   Number of children: Not on file   Years of education: Not on file   Highest education level: Not on file  Occupational History   Not on file  Tobacco Use   Smoking status: Every Day    Current packs/day: 0.25    Average packs/day: 0.3 packs/day for 45.0 years (11.3 ttl pk-yrs)    Types: Cigarettes   Smokeless tobacco: Never  Vaping Use   Vaping status: Never Used  Substance and Sexual Activity   Alcohol use: Yes    Alcohol/week: 35.0 standard drinks of alcohol    Types: 35 Cans of beer per week    Comment: 5-6 beers per day   Drug use: No   Sexual activity: Not Currently  Other Topics Concern   Not on file  Social History Narrative   Not on file   Social Drivers of Health   Financial Resource Strain: Low Risk  (04/25/2024)   Overall Financial Resource Strain (CARDIA)    Difficulty of Paying Living Expenses: Not hard at all  Food Insecurity: Not on file  Transportation Needs: No Transportation Needs (04/25/2024)   PRAPARE - Administrator, Civil Service (Medical): No    Lack of Transportation (Non-Medical): No  Physical Activity: Inactive (04/25/2024)   Exercise Vital Sign    Days of Exercise per Week: 0 days    Minutes of Exercise per Session: 0 min  Stress: No Stress Concern Present (04/25/2024)   [employer] of Occupational Health - Occupational Stress Questionnaire    Feeling of Stress: Only a little  Social Connections: Moderately Isolated (04/25/2024)   Social Connection and Isolation Panel    Frequency of Communication with Friends and Family: More than three times a week    Frequency of Social Gatherings with Friends and Family: More than three times a week    Attends Religious Services: 1 to 4 times per year    Active Member of [employer] or Organizations: No     Attends Banker Meetings: Never    Marital Status: Widowed  Intimate Partner Violence: Not At Risk (04/25/2024)   Humiliation, Afraid, Rape, and Kick questionnaire    Fear of Current or Ex-Partner: No    Emotionally Abused: No    Physically Abused: No    Sexually Abused: No    FAMILY HISTORY: Family History  Problem Relation Age of Onset   Hypertension Father    Lung cancer Father 56       brick mason    ALLERGIES:  is allergic to carvedilol .  MEDICATIONS:  No current outpatient medications on file.   No current facility-administered medications for this visit.    REVIEW OF SYSTEMS:   Constitutional: Denies fevers, chills or abnormal night sweats Eyes: Denies blurriness of vision,  double vision or watery eyes Ears, nose, mouth, throat, and face: Denies mucositis or sore throat Respiratory: Denies cough, dyspnea or wheezes Cardiovascular: Denies palpitation, chest discomfort or lower extremity swelling Gastrointestinal:  Denies nausea, heartburn or change in bowel habits Skin: Denies abnormal skin rashes Lymphatics: Denies new lymphadenopathy or easy bruising Neurological:Denies numbness, tingling or new weaknesses Behavioral/Psych: Mood is stable, no new changes  All other systems were reviewed with the patient and are negative.  PHYSICAL EXAMINATION: ECOG PERFORMANCE STATUS: 1 - Symptomatic but completely ambulatory  Vitals:   05/18/24 1111  BP: (!) 142/104  Pulse: 94  Resp: 18  Temp: 99.2 F (37.3 C)  SpO2: 95%   Filed Weights   05/18/24 1111  Weight: 150 lb 12.8 oz (68.4 kg)    GENERAL:alert, no distress and comfortable SKIN: skin color, texture, turgor are normal, no rashes or significant lesions EYES: normal, conjunctiva are pink and non-injected, sclera clear OROPHARYNX:no exudate, no erythema and lips, buccal mucosa, and tongue normal  NECK: supple, thyroid normal size, non-tender, without nodularity LYMPH:  no palpable lymphadenopathy in  the cervical, axillary or inguinal LUNGS: clear to auscultation and percussion with normal breathing effort HEART: regular rate & rhythm and no murmurs and no lower extremity edema ABDOMEN:abdomen soft, non-tender and normal bowel sounds Musculoskeletal:no cyanosis of digits and no clubbing  PSYCH: alert & oriented x 3 with fluent speech NEURO: no focal motor/sensory deficits  LABORATORY DATA:  I have reviewed the data as listed Lab Results  Component Value Date   WBC 6.1 04/25/2024   HGB 17.6 04/25/2024   HCT 51.3 (H) 04/25/2024   MCV 96 04/25/2024   PLT 56 (LL) 04/25/2024     RADIOGRAPHIC STUDIES: I have personally reviewed the radiological images as listed and agreed with the findings in the report. CT ABDOMEN PELVIS W CONTRAST Result Date: 05/14/2024 CLINICAL DATA:  Elevated liver function tests.  Thrombocytopenia. EXAM: CT ABDOMEN AND PELVIS WITH CONTRAST TECHNIQUE: Multidetector CT imaging of the abdomen and pelvis was performed using the standard protocol following bolus administration of intravenous contrast. RADIATION DOSE REDUCTION: This exam was performed according to the departmental dose-optimization program which includes automated exposure control, adjustment of the mA and/or kV according to patient size and/or use of iterative reconstruction technique. CONTRAST:  100mL ISOVUE -300 IOPAMIDOL  (ISOVUE -300) INJECTION 61% COMPARISON:  None Available. FINDINGS: Lower Chest: No acute findings. Hepatobiliary: Marked diffuse hepatic steatosis is demonstrated. No gross morphologic signs of cirrhosis identified. No suspicious hepatic masses identified. Gallbladder is unremarkable. No evidence of biliary ductal dilatation. Pancreas:  No mass or inflammatory changes. Spleen: Within normal limits in size and appearance. Adrenals/Urinary Tract: No suspicious masses identified. Small benign-appearing right renal cyst noted (No followup imaging is recommended). No evidence of ureteral calculi or  hydronephrosis. Mucosal hyperenhancement seen in the urinary bladder, suspicious for cystitis. Stomach/Bowel: No evidence of obstruction, inflammatory process or abnormal fluid collections. Normal appendix visualized. Vascular/Lymphatic: No pathologically enlarged lymph nodes. No acute vascular findings. 3.0 cm infrarenal abdominal aortic aneurysm noted. Reproductive:  No mass or other significant abnormality. Other:  None. Musculoskeletal:  No suspicious bone lesions identified. IMPRESSION: Marked hepatic steatosis. No gross morphologic signs of cirrhosis or hepatic mass. No evidence of splenomegaly. Mucosal hyperenhancement in urinary bladder, which may be seen with cystitis. Suggest correlation with urinalysis. 3.0 cm infrarenal abdominal aortic aneurysm. Recommend follow-up ultrasound every 3 years. (Ref.: J Vasc Surg. 2018; 67:2-77 and J Am Coll Radiol 2013;10(10):789-794.) Electronically Signed   By: Norleen DELENA Graham CHRISTELLA.D.  On: 05/14/2024 15:12

## 2024-05-18 NOTE — Assessment & Plan Note (Signed)
 We discussed importance of tobacco cessation but it does not appear that the patient is interested to quit

## 2024-05-18 NOTE — Assessment & Plan Note (Signed)
 I have reviewed extensive workup already performed by his primary care doctor The most likely cause of his thrombocytopenia is from alcoholic liver disease and it is not likely to improve unless the patient stay abstinent or reduce his alcohol intake Rather than repeating labs today, I recommend the patient to attempt to reduce his alcohol intake to less than 2 beers per day and I will repeat his labs in the month for further follow-up I will check B12 level in his next visit

## 2024-05-18 NOTE — Assessment & Plan Note (Signed)
 I recommend the patient attempt to reduce his alcohol intake down to 2 or less beer per day

## 2024-05-21 ENCOUNTER — Other Ambulatory Visit: Payer: Self-pay | Admitting: Medical

## 2024-05-21 MED ORDER — ROSUVASTATIN CALCIUM 10 MG PO TABS: 10.0000 mg | ORAL_TABLET | Freq: Every day | ORAL | 0 refills | Status: AC

## 2024-05-21 MED ORDER — ASPIRIN 81 MG PO TBEC: 81.0000 mg | DELAYED_RELEASE_TABLET | Freq: Every day | ORAL | 3 refills | Status: AC

## 2024-05-21 NOTE — Progress Notes (Signed)
 CT chest shows no lung cancer thankfully.    There is however quite a bit of cholesterol plaque in your coronary arteries and aorta.  It is strongly recommended that you be on cholesterol medication and aspirin  daily to lower your cholesterol.  I would also recommend a baseline visit with cardiology to discuss your findings.  Let me know if agreeable to cardiology consult?  I recommend fasting 6 week follow up after starting cholesterol medication.

## 2024-06-15 ENCOUNTER — Inpatient Hospital Stay: Attending: Hematology and Oncology | Admitting: Hematology and Oncology

## 2024-06-15 ENCOUNTER — Encounter: Payer: Self-pay | Admitting: Hematology and Oncology

## 2024-06-15 ENCOUNTER — Inpatient Hospital Stay

## 2024-06-15 VITALS — BP 162/87 | HR 84 | Temp 99.3°F | Resp 18 | Ht 67.5 in | Wt 149.6 lb

## 2024-06-15 DIAGNOSIS — K709 Alcoholic liver disease, unspecified: Secondary | ICD-10-CM | POA: Diagnosis not present

## 2024-06-15 DIAGNOSIS — D696 Thrombocytopenia, unspecified: Secondary | ICD-10-CM | POA: Insufficient documentation

## 2024-06-15 DIAGNOSIS — Z720 Tobacco use: Secondary | ICD-10-CM

## 2024-06-15 DIAGNOSIS — F17290 Nicotine dependence, other tobacco product, uncomplicated: Secondary | ICD-10-CM | POA: Insufficient documentation

## 2024-06-15 DIAGNOSIS — F1010 Alcohol abuse, uncomplicated: Secondary | ICD-10-CM | POA: Diagnosis not present

## 2024-06-15 LAB — CMP (CANCER CENTER ONLY)
ALT: 131 U/L — ABNORMAL HIGH (ref 0–44)
AST: 160 U/L — ABNORMAL HIGH (ref 15–41)
Albumin: 3.7 g/dL (ref 3.5–5.0)
Alkaline Phosphatase: 154 U/L — ABNORMAL HIGH (ref 38–126)
Anion gap: 8 (ref 5–15)
BUN: 9 mg/dL (ref 8–23)
CO2: 26 mmol/L (ref 22–32)
Calcium: 8.8 mg/dL — ABNORMAL LOW (ref 8.9–10.3)
Chloride: 104 mmol/L (ref 98–111)
Creatinine: 0.6 mg/dL — ABNORMAL LOW (ref 0.61–1.24)
GFR, Estimated: >60 mL/min (ref >60)
Glucose, Bld: 106 mg/dL — ABNORMAL HIGH (ref 70–99)
Potassium: 4 mmol/L (ref 3.5–5.1)
Sodium: 138 mmol/L (ref 135–145)
Total Bilirubin: 1.6 mg/dL — ABNORMAL HIGH (ref 0.0–1.2)
Total Protein: 7 g/dL (ref 6.5–8.1)

## 2024-06-15 LAB — CBC WITH DIFFERENTIAL (CANCER CENTER ONLY)
Abs Immature Granulocytes: 0.04 K/uL (ref 0.00–0.07)
Basophils Absolute: 0.1 K/uL (ref 0.0–0.1)
Basophils Relative: 2 %
Eosinophils Absolute: 0.2 K/uL (ref 0.0–0.5)
Eosinophils Relative: 3 %
HCT: 42.4 % (ref 39.0–52.0)
Hemoglobin: 14.9 g/dL (ref 13.0–17.0)
Immature Granulocytes: 1 %
Lymphocytes Relative: 32 %
Lymphs Abs: 1.9 K/uL (ref 0.7–4.0)
MCH: 32.6 pg (ref 26.0–34.0)
MCHC: 35.1 g/dL (ref 30.0–36.0)
MCV: 92.8 fL (ref 80.0–100.0)
Monocytes Absolute: 0.7 K/uL (ref 0.1–1.0)
Monocytes Relative: 11 %
Neutro Abs: 3.1 K/uL (ref 1.7–7.7)
Neutrophils Relative %: 51 %
Platelet Count: 95 K/uL — ABNORMAL LOW (ref 150–400)
RBC: 4.57 MIL/uL (ref 4.22–5.81)
RDW: 17.2 % — ABNORMAL HIGH (ref 11.5–15.5)
WBC Count: 6 K/uL (ref 4.0–10.5)
nRBC: 0 % (ref 0.0–0.2)

## 2024-06-15 LAB — VITAMIN B12: Vitamin B-12: 494 pg/mL (ref 180–914)

## 2024-06-15 NOTE — Assessment & Plan Note (Addendum)
 We discussed importance of tobacco cessation but it does not appear that the patient is interested to quit

## 2024-06-15 NOTE — Assessment & Plan Note (Addendum)
 I have reviewed extensive workup already performed by his primary care doctor The most likely cause of his thrombocytopenia is from alcoholic liver disease and it is not likely to improve unless the patient stay abstinent or reduce his alcohol intake  Repeat labs show improvement of his thrombocytopenia B12 is pending I will see him once a year to follow

## 2024-06-15 NOTE — Progress Notes (Signed)
 [facility] OFFICE PROGRESS NOTE  Patient Care Team: Tysinger, Alm RAMAN, PA-C as PCP - General (Family Medicine) Claudene Victory ORN, MD (Inactive) as PCP - Cardiology (Cardiology)  Assessment & Plan Thrombocytopenia [facility]) I have reviewed extensive workup already performed by his primary care doctor The most likely cause of his thrombocytopenia is from alcoholic liver disease and it is not likely to improve unless the patient stay abstinent or reduce his alcohol intake  Repeat labs show improvement of his thrombocytopenia B12 is pending I will see him once a year to follow Alcoholic liver disease (HCC) CT imaging which confirmed diagnosis of liver disease, in his case, likely secondary to chronic alcohol use We discussed importance of staying abstinent to prevent further liver damage Tobacco abuse We discussed importance of tobacco cessation but it does not appear that the patient is interested to quit  Orders Placed This Encounter  Procedures   CBC with Differential (Cancer Center Only)    Standing Status:   Future    Expiration Date:   06/15/2025   CMP (Cancer Center only)    Standing Status:   Future    Expiration Date:   06/15/2025     Almarie Bedford, MD  INTERVAL HISTORY: he returns for surveillance follow-up for chronic thrombocytopenia in the setting of alcoholic liver disease Patient denies recent bleeding such as epistaxis, hematuria or hematochezia We discussed test results and future plan of care as outlined above He is still drinking 4 beers 3 to 4 days a week He is not interested to quit smoking right now  PHYSICAL EXAMINATION: ECOG PERFORMANCE STATUS: 0 - Asymptomatic  Vitals:   06/15/24 0950  BP: (!) 162/87  Pulse: 84  Resp: 18  Temp: 99.3 F (37.4 C)  SpO2: 100%   Lab Results  Component Value Date   WBC 6.0 06/15/2024   HGB 14.9 06/15/2024   HCT 42.4 06/15/2024   MCV 92.8 06/15/2024   PLT 95 (L) 06/15/2024   SUMMARY OF HEMATOLOGIC  HISTORY:  He was found to have abnormal CBC from recent blood work I have the opportunity to review his CBC dated back to 2018 On April 05, 2017, platelet count was 190 On August 22, 2017, platelet count was 198 On May 07, 2022, platelet count was 167 On April 25, 2024, platelet count was 56 He denies recent bruising/bleeding, such as spontaneous epistaxis, hematuria, melena or hematochezia The patient denies history of liver disease or hepatitis He denies prior blood or platelet transfusions He has significant history of heart disease, hyperlipidemia and known to have chronic alcohol abuse and smoking According to the patient, he started smoking at least around the age of 5 and usually smoke about 10 cigar/day He has been drinking 1-2 beers for over 40 years but most recently, might drink up to 5 beers a day CT imaging of the abdomen and pelvis in July 2025 show marked hepatic steatosis

## 2024-06-15 NOTE — Assessment & Plan Note (Addendum)
 CT imaging which confirmed diagnosis of liver disease, in his case, likely secondary to chronic alcohol use We discussed importance of staying abstinent to prevent further liver damage

## 2024-07-23 ENCOUNTER — Encounter: Payer: Self-pay | Admitting: Medical

## 2025-05-01 ENCOUNTER — Ambulatory Visit: Payer: Self-pay | Admitting: Medical

## 2025-06-17 ENCOUNTER — Ambulatory Visit: Admitting: Hematology and Oncology

## 2025-06-17 ENCOUNTER — Other Ambulatory Visit
